# Patient Record
Sex: Female | Born: 1935 | Race: Black or African American | Hispanic: No | Marital: Single | Smoking: Never smoker
Health system: Southern US, Community
[De-identification: ages and names within clinical notes are randomized; demographics above are authoritative.]

## PROBLEM LIST (undated history)

## (undated) DIAGNOSIS — Z950 Presence of cardiac pacemaker: Secondary | ICD-10-CM

## (undated) DIAGNOSIS — G309 Alzheimer's disease, unspecified: Secondary | ICD-10-CM

## (undated) DIAGNOSIS — F0280 Dementia in other diseases classified elsewhere without behavioral disturbance: Secondary | ICD-10-CM

## (undated) DIAGNOSIS — I10 Essential (primary) hypertension: Secondary | ICD-10-CM

## (undated) DIAGNOSIS — R569 Unspecified convulsions: Secondary | ICD-10-CM

## (undated) DIAGNOSIS — E7800 Pure hypercholesterolemia, unspecified: Secondary | ICD-10-CM

## (undated) DIAGNOSIS — M109 Gout, unspecified: Secondary | ICD-10-CM

## (undated) DIAGNOSIS — E119 Type 2 diabetes mellitus without complications: Secondary | ICD-10-CM

## (undated) DIAGNOSIS — I639 Cerebral infarction, unspecified: Secondary | ICD-10-CM

## (undated) DIAGNOSIS — M1990 Unspecified osteoarthritis, unspecified site: Secondary | ICD-10-CM

## (undated) HISTORY — PX: INSERT / REPLACE / REMOVE PACEMAKER: SUR710

## (undated) HISTORY — PX: CATARACT EXTRACTION, BILATERAL: SHX1313

---

## 2016-01-25 ENCOUNTER — Encounter (HOSPITAL_COMMUNITY): Payer: Self-pay | Admitting: Emergency Medicine

## 2016-01-25 ENCOUNTER — Inpatient Hospital Stay (HOSPITAL_COMMUNITY)
Admission: EM | Admit: 2016-01-25 | Discharge: 2016-01-28 | DRG: 689 | Disposition: A | Discharge status: Home Health | Payer: Medicare Other | Attending: Family Medicine | Admitting: Family Medicine

## 2016-01-25 ENCOUNTER — Emergency Department (HOSPITAL_COMMUNITY): Payer: Medicare Other

## 2016-01-25 DIAGNOSIS — I5022 Chronic systolic (congestive) heart failure: Secondary | ICD-10-CM | POA: Diagnosis present

## 2016-01-25 DIAGNOSIS — Z87891 Personal history of nicotine dependence: Secondary | ICD-10-CM

## 2016-01-25 DIAGNOSIS — E7800 Pure hypercholesterolemia, unspecified: Secondary | ICD-10-CM | POA: Diagnosis present

## 2016-01-25 DIAGNOSIS — N179 Acute kidney failure, unspecified: Secondary | ICD-10-CM | POA: Diagnosis present

## 2016-01-25 DIAGNOSIS — F0280 Dementia in other diseases classified elsewhere without behavioral disturbance: Secondary | ICD-10-CM | POA: Diagnosis present

## 2016-01-25 DIAGNOSIS — I110 Hypertensive heart disease with heart failure: Secondary | ICD-10-CM | POA: Diagnosis present

## 2016-01-25 DIAGNOSIS — N390 Urinary tract infection, site not specified: Principal | ICD-10-CM | POA: Diagnosis present

## 2016-01-25 DIAGNOSIS — G309 Alzheimer's disease, unspecified: Secondary | ICD-10-CM | POA: Diagnosis present

## 2016-01-25 DIAGNOSIS — R269 Unspecified abnormalities of gait and mobility: Secondary | ICD-10-CM

## 2016-01-25 DIAGNOSIS — Z79899 Other long term (current) drug therapy: Secondary | ICD-10-CM

## 2016-01-25 DIAGNOSIS — Z794 Long term (current) use of insulin: Secondary | ICD-10-CM

## 2016-01-25 DIAGNOSIS — Z950 Presence of cardiac pacemaker: Secondary | ICD-10-CM | POA: Diagnosis present

## 2016-01-25 DIAGNOSIS — Z95810 Presence of automatic (implantable) cardiac defibrillator: Secondary | ICD-10-CM

## 2016-01-25 DIAGNOSIS — I10 Essential (primary) hypertension: Secondary | ICD-10-CM | POA: Diagnosis not present

## 2016-01-25 DIAGNOSIS — I639 Cerebral infarction, unspecified: Secondary | ICD-10-CM | POA: Diagnosis present

## 2016-01-25 DIAGNOSIS — R55 Syncope and collapse: Secondary | ICD-10-CM | POA: Diagnosis not present

## 2016-01-25 DIAGNOSIS — I4891 Unspecified atrial fibrillation: Secondary | ICD-10-CM | POA: Diagnosis present

## 2016-01-25 DIAGNOSIS — I69954 Hemiplegia and hemiparesis following unspecified cerebrovascular disease affecting left non-dominant side: Secondary | ICD-10-CM

## 2016-01-25 DIAGNOSIS — R29810 Facial weakness: Secondary | ICD-10-CM | POA: Diagnosis present

## 2016-01-25 DIAGNOSIS — E119 Type 2 diabetes mellitus without complications: Secondary | ICD-10-CM | POA: Diagnosis not present

## 2016-01-25 DIAGNOSIS — Z9181 History of falling: Secondary | ICD-10-CM

## 2016-01-25 DIAGNOSIS — R569 Unspecified convulsions: Secondary | ICD-10-CM

## 2016-01-25 DIAGNOSIS — W19XXXA Unspecified fall, initial encounter: Secondary | ICD-10-CM | POA: Diagnosis present

## 2016-01-25 DIAGNOSIS — G9340 Encephalopathy, unspecified: Secondary | ICD-10-CM | POA: Diagnosis present

## 2016-01-25 HISTORY — DX: Unspecified convulsions: R56.9

## 2016-01-25 HISTORY — DX: Cerebral infarction, unspecified: I63.9

## 2016-01-25 HISTORY — DX: Gout, unspecified: M10.9

## 2016-01-25 HISTORY — DX: Dementia in other diseases classified elsewhere, unspecified severity, without behavioral disturbance, psychotic disturbance, mood disturbance, and anxiety: F02.80

## 2016-01-25 HISTORY — DX: Presence of cardiac pacemaker: Z95.0

## 2016-01-25 HISTORY — DX: Essential (primary) hypertension: I10

## 2016-01-25 HISTORY — DX: Alzheimer's disease, unspecified: G30.9

## 2016-01-25 HISTORY — DX: Type 2 diabetes mellitus without complications: E11.9

## 2016-01-25 HISTORY — DX: Unspecified osteoarthritis, unspecified site: M19.90

## 2016-01-25 HISTORY — DX: Pure hypercholesterolemia, unspecified: E78.00

## 2016-01-25 LAB — COMPREHENSIVE METABOLIC PANEL
ALT: 13 U/L — ABNORMAL LOW (ref 14–54)
ANION GAP: 7 (ref 5–15)
AST: 19 U/L (ref 15–41)
Albumin: 2.8 g/dL — ABNORMAL LOW (ref 3.5–5.0)
Alkaline Phosphatase: 112 U/L (ref 38–126)
BUN: 21 mg/dL — ABNORMAL HIGH (ref 6–20)
CHLORIDE: 112 mmol/L — AB (ref 101–111)
CO2: 21 mmol/L — ABNORMAL LOW (ref 22–32)
Calcium: 9.3 mg/dL (ref 8.9–10.3)
Creatinine, Ser: 2.02 mg/dL — ABNORMAL HIGH (ref 0.44–1.00)
GFR, EST AFRICAN AMERICAN: 25 mL/min — AB (ref >60)
GFR, EST NON AFRICAN AMERICAN: 22 mL/min — AB (ref >60)
Glucose, Bld: 119 mg/dL — ABNORMAL HIGH (ref 65–99)
POTASSIUM: 3.9 mmol/L (ref 3.5–5.1)
Sodium: 140 mmol/L (ref 135–145)
Total Bilirubin: 1.1 mg/dL (ref 0.3–1.2)
Total Protein: 6.8 g/dL (ref 6.5–8.1)

## 2016-01-25 LAB — CBG MONITORING, ED: Glucose-Capillary: 111 mg/dL — ABNORMAL HIGH (ref 65–99)

## 2016-01-25 LAB — CBC
HEMATOCRIT: 44 % (ref 36.0–46.0)
Hemoglobin: 15 g/dL (ref 12.0–15.0)
MCH: 29.8 pg (ref 26.0–34.0)
MCHC: 34.1 g/dL (ref 30.0–36.0)
MCV: 87.3 fL (ref 78.0–100.0)
Platelets: 137 10*3/uL — ABNORMAL LOW (ref 150–400)
RBC: 5.04 MIL/uL (ref 3.87–5.11)
RDW: 14.1 % (ref 11.5–15.5)
WBC: 9.5 10*3/uL (ref 4.0–10.5)

## 2016-01-25 LAB — PROTIME-INR
INR: 1.02
PROTHROMBIN TIME: 13.4 s (ref 11.4–15.2)

## 2016-01-25 LAB — I-STAT TROPONIN, ED: TROPONIN I, POC: 0.03 ng/mL (ref 0.00–0.08)

## 2016-01-25 LAB — RAPID URINE DRUG SCREEN, HOSP PERFORMED
Amphetamines: NOT DETECTED
BARBITURATES: NOT DETECTED
BENZODIAZEPINES: NOT DETECTED
COCAINE: NOT DETECTED
Opiates: NOT DETECTED
TETRAHYDROCANNABINOL: NOT DETECTED

## 2016-01-25 LAB — I-STAT CG4 LACTIC ACID, ED: LACTIC ACID, VENOUS: 1.71 mmol/L (ref 0.5–1.9)

## 2016-01-25 LAB — APTT: aPTT: 28 seconds (ref 24–36)

## 2016-01-25 MED ORDER — SODIUM CHLORIDE 0.9 % IV SOLN
1000.0000 mg | Freq: Once | INTRAVENOUS | Status: AC
  Administered 2016-01-25: 1000 mg via INTRAVENOUS
  Filled 2016-01-25: qty 10

## 2016-01-25 NOTE — ED Triage Notes (Signed)
Pt unable to give history, pt does have a bag of medication. Keppra filled 11/12/15, prescribed BID, bottle is full at this time.  Pt is alert, intermittently yelling out, pt does respond verbally when spoken tol.  Will wait for family, [place] on bottle is [place], [place]

## 2016-01-25 NOTE — ED Provider Notes (Signed)
[facility]-EMERGENCY DEPT Provider Note   CSN: [id number] Arrival date & time: 01/25/16  2055     History   Chief Complaint Chief Complaint  Patient presents with  . Loss of Consciousness    HPI [place] Gaines is a 82 y.o. female.  The history is provided by the EMS personnel. The history is limited by the condition of the patient (nonverbal, intermittently cooperative with commands).  Altered Mental Status   This is a new problem. The current episode started less than 1 hour ago. The problem has been gradually improving. Associated symptoms include somnolence and unresponsiveness. Risk factors include the patient not taking medications correctly (noted to have full Keppra bottle, filled 11/2015). Her past medical history is significant for seizures.    Past Medical History:  Diagnosis Date  . Alzheimer disease    per family  . Diabetes mellitus without complication (HCC)   . Hypercholesteremia   . Hypertension   . Pacemaker   . Seizures (HCC)   . Stroke [facility](HCC)     Patient Active Problem List   Diagnosis Date Noted  . Syncope 01/25/2016    Past Surgical History:  Procedure Laterality Date  . PERMANENT PACEMAKER INSERTION      OB History    No data available       Home Medications    Prior to Admission medications   Medication Sig Start Date End Date Taking? Authorizing Provider  calcitRIOL (ROCALTROL) 0.25 MCG capsule Take 0.25 mcg by mouth 3 (three) times a week. AS DIRECTED   Yes Historical Provider, MD  Insulin Glargine (LANTUS SOLOSTAR) 100 UNIT/ML Solostar Pen Inject 10 Units into the skin at bedtime.   Yes Historical Provider, MD  levETIRAcetam (KEPPRA) 1000 MG tablet Take 1,000 mg by mouth 2 (two) times daily.   Yes Historical Provider, MD  metoprolol succinate (TOPROL-XL) 50 MG 24 hr tablet Take 50 mg by mouth daily. Take with or immediately following a meal.   Yes Historical Provider, MD  rosuvastatin (CRESTOR) 10 MG tablet Take 10 mg by mouth daily.    Yes Historical Provider, MD  spironolactone (ALDACTONE) 25 MG tablet Take 25 mg by mouth every morning.   Yes Historical Provider, MD    Family History No family history on file.  Social History Social History  Substance Use Topics  . Smoking status: Former Games developermoker  . Smokeless tobacco: Never Used  . Alcohol use No     Allergies   Review of patient's allergies indicates no known allergies.   Review of Systems Review of Systems  Unable to perform ROS: Patient nonverbal     Physical Exam Updated Vital Signs BP 124/64   Pulse (!) 59   Temp 98.4 F (36.9 C) (Oral)   Resp 17   SpO2 100%   Physical Exam  Constitutional: She appears well-developed and well-nourished. No distress.  Intermittently cooperative with commands, unclear if understanding tasks being asked or questions. Hollers out "hey!" loudly to everything said/asked  HENT:  Head: Normocephalic and atraumatic.  Eyes: Conjunctivae are normal. Pupils are equal, round, and reactive to light. No scleral icterus.  Not cooperative for EOM's  Neck: Normal range of motion. Neck supple.  Cardiovascular: Normal rate and intact distal pulses.   Murmur heard. Irregular rhythm  Pulmonary/Chest: Effort normal and breath sounds normal. No respiratory distress.  Abdominal: Soft. She exhibits no distension. There is no tenderness.  Musculoskeletal: She exhibits no edema, tenderness or deformity.  Neurological: She is alert.  Awake, alert moves  b/l Ue's and RLE spontaneously. Appears to have left lower facial droop. Cooperates for grip, seems slightly weaker on left compared to right  Skin: Skin is warm and dry. Capillary refill takes less than 2 seconds. No rash noted. She is not diaphoretic.  Nursing note and vitals reviewed.    ED Treatments / Results  Labs (all labs ordered are listed, but only abnormal results are displayed) Labs Reviewed  CBC - Abnormal; Notable for the following:       Result Value   Platelets  137 (*)    All other components within normal limits  COMPREHENSIVE METABOLIC PANEL - Abnormal; Notable for the following:    Chloride 112 (*)    CO2 21 (*)    Glucose, Bld 119 (*)    BUN 21 (*)    Creatinine, Ser 2.02 (*)    Albumin 2.8 (*)    ALT 13 (*)    GFR calc non Af Amer 22 (*)    GFR calc Af Amer 25 (*)    All other components within normal limits  CBG MONITORING, ED - Abnormal; Notable for the following:    Glucose-Capillary 111 (*)    All other components within normal limits  RAPID URINE DRUG SCREEN, HOSP PERFORMED  PROTIME-INR  APTT  URINALYSIS, ROUTINE W REFLEX MICROSCOPIC (NOT AT [facility])  I-STAT CG4 LACTIC ACID, ED  I-STAT TROPOININ, ED    EKG  EKG Interpretation  Date/Time:  Wednesday January 25 2016 21:05:53 EDT Ventricular Rate:  66 PR Interval:    QRS Duration: 116 QT Interval:  450 QTC Calculation: 486 R Axis:   -72 Text Interpretation:  Atrial fibrillation Left anterior fascicular block Abnormal T, consider ischemia, lateral leads No previous ECGs available Confirmed by Manus Gunning  MD, STEPHEN [phone]) on 01/25/2016 9:21:41 PM       Radiology Ct Head Wo Contrast  Result Date: 01/25/2016 CLINICAL DATA:  Found unresponsive. Speech disturbance. History of seizures. Left facial droop and left-sided weakness. EXAM: CT HEAD WITHOUT CONTRAST TECHNIQUE: Contiguous axial images were obtained from the base of the skull through the vertex without intravenous contrast. COMPARISON:  None. FINDINGS: Brain: No brainstem lesion is seen. There is cerebellar atrophy. There is an old infarction in the right middle cerebral artery territory affecting the insula, lateral basal ganglia and frontoparietal cortical and subcortical brain, with progression to atrophy and encephalomalacia. There are chronic small-vessel white matter ischemic changes in both hemispheres. No sign of acute infarction, mass lesion, hemorrhage, hydrocephalus or extra-axial collection. Vascular: There is  atherosclerotic calcification of the major vessels at the base of the brain. Skull: No fracture. Sinuses/Orbits: Sinuses are clear except for small amount of fluid in the left maxillary sinus. Other: None significant IMPRESSION: No acute finding by CT. Old infarction in the right MCA territory. Chronic small-vessel ischemic changes. Generalized brain atrophy. Electronically Signed   By: Paulina Fusi M.D.   On: 01/25/2016 23:03   Dg Chest Portable 1 View  Result Date: 01/25/2016 CLINICAL DATA:  Loss of consciousness today.  History of seizures. EXAM: PORTABLE CHEST 1 VIEW COMPARISON:  None. FINDINGS: There is left ventricular prominence. Pacemaker AICD is in place. There is aortic atherosclerosis. The pulmonary vascularity is normal. The lungs are clear. No effusions. No acute bone finding. IMPRESSION: No active disease. Left ventricular prominence. AICD. Aortic atherosclerosis. Electronically Signed   By: Paulina Fusi M.D.   On: 01/25/2016 23:00    Procedures Procedures (including critical care time)  Medications Ordered in ED  Medications  levETIRAcetam (KEPPRA) 1,000 mg in sodium chloride 0.9 % 100 mL IVPB (1,000 mg Intravenous New Bag/Given 01/25/16 2333)     Initial Impression / Assessment and Plan / ED Course  I have reviewed the triage vital signs and the nursing notes.  Pertinent labs & imaging results that were available during my care of the patient were reviewed by me and considered in my medical decision making (see chart for details).  Clinical Course   [place] Gaines is a 82 y.o. female with h/o seizure disorder who presents to ED via EMS from family member's home where she now resides for unresponsiveness x 15 minutes prior to EMS arrival. On EMS arrival to pick up pt, CBG > 100, VSS, confused and hollering "hey!" out loud to all verbal communication, which family told EMS is normal for her. H&P, as well as PMSHx limited 2/2 no medical records here or in care everywhere. By  review of medications, other medical problems include apparent IDDM (on lantus), HTN, HLD, seizures. Of note, keppra bottle filled in 11/2015 appears full, at first under impression that pt is not taking, so loaded with keppra in ED for possible seizure. However, after keppra load, family arrived, states pt has been taking keppra BID with old Rx from bottle that was not sent with EMS.  Per family, pt had a similar episode Saturday (4 days ago) where she was sitting in her wheelchair with baseline interactions looking and interactive with them, then suddenly slumped over unresponsive x 20 minutes. When paramedics arrived, her BG at that time was 69, she improved and glucose improved so not transported at that time.   Concern for two episodes of prolonged syncope in last 4 days. Will benefit from telemetry. No obvious infection on CXR. UDS negative. Cr elevated to 2, unknown baseline, pt family does not know if pt has kidney problems. CT head with old right MCA CVA (c/w pt h/o chronic left sided weakness). Not clinically appearing dehydrated.  Pt condition, course, and admission were discussed with attending physician Dr. Glynn Octave.  Final Clinical Impressions(s) / ED Diagnoses   Final diagnoses:  Syncope and collapse    New Prescriptions New Prescriptions   No medications on file     Horald Pollen, MD 01/25/16 2358    Glynn Octave, MD 01/26/16 [phone]

## 2016-01-25 NOTE — H&P (Signed)
History and Physical    [place] ZOX:[id number] DOB: 07-09-1930 DOA: 01/25/2016  Referring MD/NP/PA:   PCP: No primary care provider on file.   Patient coming from:  The patient is coming from home.  At baseline, pt is partially dependent for her ADL.   Chief Complaint: syncope, AMS and fall  HPI: [place] Gaines is a 82 y.o. female with medical history significant of Alzheimer's disease, hypertension, hyperlipidemia, diabetes mellitus, stroke with left facial droop and mild left-sided weakness at the baseline, seizure on Keppra, s/p of AICD placement, who presents with altered mental status, syncope and fall.  Pt has recently moved from [place]. Per patient's daughter-in-law, patient had syncope episode at about 6 PM. Patient was unresponsive for about 15 minutes at about 6:00 PM. No seizure activity was noted. Pt is moreconfused and yelling out. Per family, patient had a similar episode on Saturday. Recently patient has multiple falls. Per her daughter-in-law, patient has been compliant to her seizure medications Keppra. Per family, patient does not seem to have chest pain, shortness of breath, coughing, nausea, vomiting, diarrhea or abdominal pain. Not sure if she has symptoms of UTI. Patient moves all extremities. Patient does not seem to have vision or hearing loss. Pacemaker interrogation was done in ED, which did not have events.  ED Course: pt was found to have WBC 9.5, negative UDS, lactate 1.71, INR 1.02, PTT 28, creatinine 2.02, bradycardia, temperature normal. Negative chest x-ray. CT head is negative for acute intracranial abnormalities. Pt is paced on telemetry bed for observation.  Review of Systems: Could not be reviewed due to altered mental status and Alzheimer.  Allergy: No Known Allergies  Past Medical History:  Diagnosis Date  . Alzheimer disease    per family  . Diabetes mellitus without complication (HCC)   . Hypercholesteremia   . Hypertension   .  Pacemaker   . Seizures (HCC)   . Stroke [facility])     Past Surgical History:  Procedure Laterality Date  . PERMANENT PACEMAKER INSERTION      Social History:  reports that she has quit smoking. She has never used smokeless tobacco. She reports that she does not drink alcohol or use drugs.  Family History: Could not be reviewed due to altered mental status and dementia.  Prior to Admission medications   Medication Sig Start Date End Date Taking? Authorizing Provider  calcitRIOL (ROCALTROL) 0.25 MCG capsule Take 0.25 mcg by mouth 3 (three) times a week. AS DIRECTED   Yes Historical Provider, MD  Insulin Glargine (LANTUS SOLOSTAR) 100 UNIT/ML Solostar Pen Inject 10 Units into the skin at bedtime.   Yes Historical Provider, MD  levETIRAcetam (KEPPRA) 1000 MG tablet Take 1,000 mg by mouth 2 (two) times daily.   Yes Historical Provider, MD  metoprolol succinate (TOPROL-XL) 50 MG 24 hr tablet Take 50 mg by mouth daily. Take with or immediately following a meal.   Yes Historical Provider, MD  rosuvastatin (CRESTOR) 10 MG tablet Take 10 mg by mouth daily.   Yes Historical Provider, MD  spironolactone (ALDACTONE) 25 MG tablet Take 25 mg by mouth every morning.   Yes Historical Provider, MD    Physical Exam: Vitals:   01/26/16 0001 01/26/16 0100 01/26/16 0213 01/26/16 0601  BP: 140/76 129/67 (!) 160/74 (!) 144/70  Pulse: (!) 59 73 (!) 59 60  Resp: 12 22 18 18   Temp:   97.5 F (36.4 C) 98.2 F (36.8 C)  TempSrc:   Oral Oral  SpO2: 100% 100%  92% 100%  Weight:   65.5 kg (144 lb 4.8 oz)   Height:   5\' 5"  (1.651 m)    General: Not in acute distress HEENT:       Eyes: PERRL, EOMI, no scleral icterus.       ENT: No discharge from the ears and nose, no pharynx injection, no tonsillar enlargement.        Neck: No JVD, no bruit, no mass felt. Heme: No neck lymph node enlargement. Cardiac: S1/S2, RRR, No murmurs, No gallops or rubs. Respiratory:  No rales, wheezing, rhonchi or rubs. GI: Soft,  nondistended, nontender, no rebound pain, no organomegaly, BS present. GU: No hematuria Ext: No pitting leg edema bilaterally. 2+DP/PT pulse bilaterally. Musculoskeletal: No joint deformities, No joint redness or warmth, no limitation of ROM in spin. Skin: No rashes.  Neuro: Alert, not oriented X3, cranial nerves II-XII except for mild left facial droop.  Muscle strength 4/5 left arm and leg, 5/5 in right extremities. Knee reflex 1+ bilaterally. Negative Babinski's sign. Psych: Could not be reviewed due to altered mental status and dementia.   Labs on Admission: I have personally reviewed following labs and imaging studies  CBC:  Recent Labs Lab 01/25/16 2124  WBC 9.5  HGB 15.0  HCT 44.0  MCV 87.3  PLT 137*   Basic Metabolic Panel:  Recent Labs Lab 01/25/16 2153  NA 140  K 3.9  CL 112*  CO2 21*  GLUCOSE 119*  BUN 21*  CREATININE 2.02*  CALCIUM 9.3   GFR: Estimated Creatinine Clearance: 19 mL/min (by C-G formula based on SCr of 2.02 mg/dL (H)). Liver Function Tests:  Recent Labs Lab 01/25/16 2153  AST 19  ALT 13*  ALKPHOS 112  BILITOT 1.1  PROT 6.8  ALBUMIN 2.8*   No results for input(s): LIPASE, AMYLASE in the last 168 hours. No results for input(s): AMMONIA in the last 168 hours. Coagulation Profile:  Recent Labs Lab 01/25/16 2153  INR 1.02   Cardiac Enzymes: No results for input(s): CKTOTAL, CKMB, CKMBINDEX, TROPONINI in the last 168 hours. BNP (last 3 results) No results for input(s): PROBNP in the last 8760 hours. HbA1C: No results for input(s): HGBA1C in the last 72 hours. CBG:  Recent Labs Lab 01/25/16 2345 01/26/16 0218  GLUCAP 111* 122*   Lipid Profile: No results for input(s): CHOL, HDL, LDLCALC, TRIG, CHOLHDL, LDLDIRECT in the last 72 hours. Thyroid Function Tests: No results for input(s): TSH, T4TOTAL, FREET4, T3FREE, THYROIDAB in the last 72 hours. Anemia Panel: No results for input(s): VITAMINB12, FOLATE, FERRITIN, TIBC, IRON,  RETICCTPCT in the last 72 hours. Urine analysis:    Component Value Date/Time   COLORURINE YELLOW 01/25/2016 2124   APPEARANCEUR CLOUDY (A) 01/25/2016 2124   LABSPEC 1.019 01/25/2016 2124   PHURINE 5.5 01/25/2016 2124   GLUCOSEU NEGATIVE 01/25/2016 2124   HGBUR NEGATIVE 01/25/2016 2124   BILIRUBINUR SMALL (A) 01/25/2016 2124   KETONESUR NEGATIVE 01/25/2016 2124   PROTEINUR 30 (A) 01/25/2016 2124   NITRITE NEGATIVE 01/25/2016 2124   LEUKOCYTESUR MODERATE (A) 01/25/2016 2124   Sepsis Labs: @LABRCNTIP (procalcitonin:4,lacticidven:4) )No results found for this or any previous visit (from the past 240 hour(s)).   Radiological Exams on Admission: Ct Head Wo Contrast  Result Date: 01/25/2016 CLINICAL DATA:  Found unresponsive. Speech disturbance. History of seizures. Left facial droop and left-sided weakness. EXAM: CT HEAD WITHOUT CONTRAST TECHNIQUE: Contiguous axial images were obtained from the base of the skull through the vertex without intravenous contrast. COMPARISON:  None. FINDINGS: Brain: No brainstem lesion is seen. There is cerebellar atrophy. There is an old infarction in the right middle cerebral artery territory affecting the insula, lateral basal ganglia and frontoparietal cortical and subcortical brain, with progression to atrophy and encephalomalacia. There are chronic small-vessel white matter ischemic changes in both hemispheres. No sign of acute infarction, mass lesion, hemorrhage, hydrocephalus or extra-axial collection. Vascular: There is atherosclerotic calcification of the major vessels at the base of the brain. Skull: No fracture. Sinuses/Orbits: Sinuses are clear except for small amount of fluid in the left maxillary sinus. Other: None significant IMPRESSION: No acute finding by CT. Old infarction in the right MCA territory. Chronic small-vessel ischemic changes. Generalized brain atrophy. Electronically Signed   By: Paulina FusiMark  Shogry M.D.   On: 01/25/2016 23:03   Dg Chest  Portable 1 View  Result Date: 01/25/2016 CLINICAL DATA:  Loss of consciousness today.  History of seizures. EXAM: PORTABLE CHEST 1 VIEW COMPARISON:  None. FINDINGS: There is left ventricular prominence. Pacemaker AICD is in place. There is aortic atherosclerosis. The pulmonary vascularity is normal. The lungs are clear. No effusions. No acute bone finding. IMPRESSION: No active disease. Left ventricular prominence. AICD. Aortic atherosclerosis. Electronically Signed   By: Paulina FusiMark  Shogry M.D.   On: 01/25/2016 23:00     EKG: Independently reviewed. Atrial fibrillation, QTC 486, PVC, LAD, T-wave inversion in lead V 4-V6 and II. No old EKG to compare with.   Assessment/Plan Principal Problem:   Syncope Active Problems:   Stroke (HCC)   Seizures (HCC)   Pacemaker   Hypertension   Hypercholesteremia   Diabetes mellitus without complication (HCC)   Alzheimer disease   Fall   Atrial fibrillation (HCC)   UTI (urinary tract infection)   Syncope: Etiology is not clear. No seizure activity noted per family. Patient moves all extremities. She has left facial droop and mild left-sided weakness from previous stroke, which seems to be not changed. CT head is negative for acute intracranial abnormalities. Pacemaker interrogation was done in ED, which did not have events.  - place tele bed for obs - Orthostatic vital signs  - Trending troponins x3  - Carotid doppler - 2d echo - Neuro checks  - check A1c, FLP - start ASA - PT/OT eval and treat - SLP - Request medical record  UTI: Urinalysis is positive with moderate amount of leukocytes, indicating UTI. Patient is not septic. Lactate is normal. Hemodynamically stable - Ceftriaxone by IV - Follow up results of urine and blood cx and amend antibiotic regimen if needed per sensitivity results  Acute encephalopathy: Likely due to combination of UTI and syncope. -Treat UTI as above -Recommend neuro check  Hx of stroke: -start ASA per  rectum -continue crestor  Seizures (HCC): -switch oral keppra to IV -check keppra level -seizure precaution  HTN: -continue metoprolol -Hold spironolactone due to AKI -IV hydralazine when necessary  HLD: Last LDL was not on  record -Continue home medications: crestor -Check FLP  DM-II: Last A1c not on record. Patient is taking lantus at home -will decrease Lantus dose from 10-->7 units daily  -SSI -Check A1c  Atrial fibrillation [facility](HCC): EKG showed atrial fibrillation, not sure if patient has history of atrial fibrillation. CHA2DS2-VASc Score is 7, needs oral anticoagulation, but [facility] due to high risk of fall. -started ASA -check TSH -f/u trop and 2d echo.  DVT ppx: SQ Lovenox Code Status: Full code Family Communication:  Yes, patient's  daugher-in  law  at bed side Disposition Plan:  Anticipate discharge back to previous home environment Consults called: none Admission status: Obs / tele  Date of Service 01/26/2016    Lorretta Harp [facility] Pager [phone]  If 7PM-7AM, please contact night-coverage [email or web address] Password [facility] 01/26/2016, 6:47 AM

## 2016-01-25 NOTE — ED Triage Notes (Signed)
Pt transported from home by EMS for c/o AMS. Per EMS family [facility] Hx of seizures.  Pt intermittently yelling out on arrival. L sided facial droop and L sided weakness at baseline from prior CVA per EMS. unsuccessfull IV x 2 by EMS.

## 2016-01-25 NOTE — ED Notes (Signed)
Pt to radiology via stretcher.  

## 2016-01-25 NOTE — ED Notes (Signed)
Family now at bedside

## 2016-01-26 ENCOUNTER — Encounter (HOSPITAL_COMMUNITY): Payer: Self-pay | Admitting: Internal Medicine

## 2016-01-26 DIAGNOSIS — F0280 Dementia in other diseases classified elsewhere without behavioral disturbance: Secondary | ICD-10-CM

## 2016-01-26 DIAGNOSIS — N179 Acute kidney failure, unspecified: Secondary | ICD-10-CM | POA: Diagnosis present

## 2016-01-26 DIAGNOSIS — I5022 Chronic systolic (congestive) heart failure: Secondary | ICD-10-CM | POA: Diagnosis present

## 2016-01-26 DIAGNOSIS — I10 Essential (primary) hypertension: Secondary | ICD-10-CM | POA: Diagnosis present

## 2016-01-26 DIAGNOSIS — I4891 Unspecified atrial fibrillation: Secondary | ICD-10-CM | POA: Diagnosis present

## 2016-01-26 DIAGNOSIS — R569 Unspecified convulsions: Secondary | ICD-10-CM

## 2016-01-26 DIAGNOSIS — I69954 Hemiplegia and hemiparesis following unspecified cerebrovascular disease affecting left non-dominant side: Secondary | ICD-10-CM | POA: Diagnosis not present

## 2016-01-26 DIAGNOSIS — Z794 Long term (current) use of insulin: Secondary | ICD-10-CM | POA: Diagnosis not present

## 2016-01-26 DIAGNOSIS — N390 Urinary tract infection, site not specified: Secondary | ICD-10-CM | POA: Diagnosis present

## 2016-01-26 DIAGNOSIS — E7800 Pure hypercholesterolemia, unspecified: Secondary | ICD-10-CM | POA: Diagnosis present

## 2016-01-26 DIAGNOSIS — I639 Cerebral infarction, unspecified: Secondary | ICD-10-CM | POA: Diagnosis present

## 2016-01-26 DIAGNOSIS — Z87891 Personal history of nicotine dependence: Secondary | ICD-10-CM | POA: Diagnosis not present

## 2016-01-26 DIAGNOSIS — Z95810 Presence of automatic (implantable) cardiac defibrillator: Secondary | ICD-10-CM | POA: Diagnosis not present

## 2016-01-26 DIAGNOSIS — W19XXXA Unspecified fall, initial encounter: Secondary | ICD-10-CM | POA: Diagnosis present

## 2016-01-26 DIAGNOSIS — R55 Syncope and collapse: Secondary | ICD-10-CM | POA: Insufficient documentation

## 2016-01-26 DIAGNOSIS — G309 Alzheimer's disease, unspecified: Secondary | ICD-10-CM | POA: Diagnosis present

## 2016-01-26 DIAGNOSIS — Z79899 Other long term (current) drug therapy: Secondary | ICD-10-CM | POA: Diagnosis not present

## 2016-01-26 DIAGNOSIS — E119 Type 2 diabetes mellitus without complications: Secondary | ICD-10-CM | POA: Diagnosis present

## 2016-01-26 DIAGNOSIS — G9340 Encephalopathy, unspecified: Secondary | ICD-10-CM | POA: Diagnosis present

## 2016-01-26 DIAGNOSIS — I110 Hypertensive heart disease with heart failure: Secondary | ICD-10-CM | POA: Diagnosis present

## 2016-01-26 DIAGNOSIS — Z950 Presence of cardiac pacemaker: Secondary | ICD-10-CM | POA: Diagnosis present

## 2016-01-26 DIAGNOSIS — Z9181 History of falling: Secondary | ICD-10-CM | POA: Diagnosis not present

## 2016-01-26 DIAGNOSIS — R29810 Facial weakness: Secondary | ICD-10-CM | POA: Diagnosis present

## 2016-01-26 LAB — CREATININE, URINE, RANDOM: Creatinine, Urine: 182.23 mg/dL

## 2016-01-26 LAB — URINALYSIS, ROUTINE W REFLEX MICROSCOPIC
GLUCOSE, UA: NEGATIVE mg/dL
HGB URINE DIPSTICK: NEGATIVE
Ketones, ur: NEGATIVE mg/dL
Nitrite: NEGATIVE
PH: 5.5 (ref 5.0–8.0)
PROTEIN: 30 mg/dL — AB
SPECIFIC GRAVITY, URINE: 1.019 (ref 1.005–1.030)

## 2016-01-26 LAB — CBC
HEMATOCRIT: 42.9 % (ref 36.0–46.0)
HEMOGLOBIN: 14.6 g/dL (ref 12.0–15.0)
MCH: 29.7 pg (ref 26.0–34.0)
MCHC: 34 g/dL (ref 30.0–36.0)
MCV: 87.4 fL (ref 78.0–100.0)
Platelets: 131 10*3/uL — ABNORMAL LOW (ref 150–400)
RBC: 4.91 MIL/uL (ref 3.87–5.11)
RDW: 14.2 % (ref 11.5–15.5)
WBC: 10.4 10*3/uL (ref 4.0–10.5)

## 2016-01-26 LAB — BASIC METABOLIC PANEL
Anion gap: 8 (ref 5–15)
BUN: 23 mg/dL — AB (ref 6–20)
CHLORIDE: 114 mmol/L — AB (ref 101–111)
CO2: 18 mmol/L — ABNORMAL LOW (ref 22–32)
Calcium: 9.1 mg/dL (ref 8.9–10.3)
Creatinine, Ser: 1.83 mg/dL — ABNORMAL HIGH (ref 0.44–1.00)
GFR calc non Af Amer: 24 mL/min — ABNORMAL LOW (ref >60)
GFR, EST AFRICAN AMERICAN: 28 mL/min — AB (ref >60)
Glucose, Bld: 98 mg/dL (ref 65–99)
POTASSIUM: 4 mmol/L (ref 3.5–5.1)
SODIUM: 140 mmol/L (ref 135–145)

## 2016-01-26 LAB — GLUCOSE, CAPILLARY
GLUCOSE-CAPILLARY: 122 mg/dL — AB (ref 65–99)
GLUCOSE-CAPILLARY: 126 mg/dL — AB (ref 65–99)
GLUCOSE-CAPILLARY: 161 mg/dL — AB (ref 65–99)
GLUCOSE-CAPILLARY: 85 mg/dL (ref 65–99)
GLUCOSE-CAPILLARY: 122 mg/dL — AB (ref 65–99)

## 2016-01-26 LAB — TSH: TSH: 2.273 u[IU]/mL (ref 0.350–4.500)

## 2016-01-26 LAB — TROPONIN I
TROPONIN I: 0.03 ng/mL — AB (ref <0.03)
TROPONIN I: 0.04 ng/mL — AB (ref <0.03)
TROPONIN I: 0.03 ng/mL — AB (ref <0.03)
Troponin I: 0.03 ng/mL (ref <0.03)

## 2016-01-26 LAB — URINE MICROSCOPIC-ADD ON

## 2016-01-26 LAB — LIPID PANEL
CHOLESTEROL: 136 mg/dL (ref 0–200)
HDL: 32 mg/dL — AB (ref >40)
LDL Cholesterol: 87 mg/dL (ref 0–99)
Total CHOL/HDL Ratio: 4.3 RATIO
Triglycerides: 83 mg/dL (ref <150)
VLDL: 17 mg/dL (ref 0–40)

## 2016-01-26 MED ORDER — SODIUM CHLORIDE 0.9 % IV SOLN: 1000.0000 mg | Freq: Two times a day (BID) | INTRAVENOUS | Status: DC

## 2016-01-26 MED ORDER — SODIUM CHLORIDE 0.9% FLUSH
3.0000 mL | Freq: Two times a day (BID) | INTRAVENOUS | Status: DC
  Administered 2016-01-26: 3 mL via INTRAVENOUS

## 2016-01-26 MED ORDER — HYDRALAZINE HCL 20 MG/ML IJ SOLN: 5.0000 mg | INTRAMUSCULAR | Status: DC | PRN

## 2016-01-26 MED ORDER — ONDANSETRON HCL 4 MG PO TABS: 4.0000 mg | ORAL_TABLET | Freq: Four times a day (QID) | ORAL | Status: DC | PRN

## 2016-01-26 MED ORDER — ENOXAPARIN SODIUM 40 MG/0.4ML ~~LOC~~ SOLN
40.0000 mg | SUBCUTANEOUS | Status: DC
  Administered 2016-01-26: 40 mg via SUBCUTANEOUS
  Filled 2016-01-26: qty 0.4

## 2016-01-26 MED ORDER — CHLORHEXIDINE GLUCONATE 0.12 % MT SOLN
15.0000 mL | Freq: Two times a day (BID) | OROMUCOSAL | Status: DC
  Administered 2016-01-26 – 2016-01-28 (×5): 15 mL via OROMUCOSAL
  Filled 2016-01-26 (×5): qty 15

## 2016-01-26 MED ORDER — ONDANSETRON HCL 4 MG/2ML IJ SOLN: 4.0000 mg | Freq: Four times a day (QID) | INTRAMUSCULAR | Status: DC | PRN

## 2016-01-26 MED ORDER — CALCITRIOL 0.25 MCG PO CAPS
0.2500 ug | ORAL_CAPSULE | ORAL | Status: DC
  Administered 2016-01-27: 0.25 ug via ORAL
  Filled 2016-01-26 (×2): qty 1

## 2016-01-26 MED ORDER — ACETAMINOPHEN 325 MG PO TABS: 650.0000 mg | ORAL_TABLET | Freq: Four times a day (QID) | ORAL | Status: DC | PRN

## 2016-01-26 MED ORDER — INSULIN GLARGINE 100 UNIT/ML ~~LOC~~ SOLN
7.0000 [IU] | Freq: Every day | SUBCUTANEOUS | Status: DC
  Administered 2016-01-26 – 2016-01-27 (×3): 7 [IU] via SUBCUTANEOUS
  Filled 2016-01-26 (×4): qty 0.07

## 2016-01-26 MED ORDER — ENOXAPARIN SODIUM 30 MG/0.3ML ~~LOC~~ SOLN
30.0000 mg | SUBCUTANEOUS | Status: DC
  Administered 2016-01-27 – 2016-01-28 (×2): 30 mg via SUBCUTANEOUS
  Filled 2016-01-26 (×2): qty 0.3

## 2016-01-26 MED ORDER — ASPIRIN 300 MG RE SUPP
150.0000 mg | Freq: Every day | RECTAL | Status: DC
  Administered 2016-01-26: 150 mg via RECTAL
  Filled 2016-01-26: qty 1

## 2016-01-26 MED ORDER — ORAL CARE MOUTH RINSE
15.0000 mL | Freq: Two times a day (BID) | OROMUCOSAL | Status: DC
  Administered 2016-01-26 – 2016-01-27 (×4): 15 mL via OROMUCOSAL

## 2016-01-26 MED ORDER — ASPIRIN 81 MG PO CHEW
81.0000 mg | CHEWABLE_TABLET | Freq: Every day | ORAL | Status: DC
  Administered 2016-01-27 – 2016-01-28 (×2): 81 mg via ORAL
  Filled 2016-01-26 (×2): qty 1

## 2016-01-26 MED ORDER — METOPROLOL SUCCINATE ER 25 MG PO TB24
25.0000 mg | ORAL_TABLET | Freq: Every day | ORAL | Status: DC
  Administered 2016-01-26 – 2016-01-28 (×3): 25 mg via ORAL
  Filled 2016-01-26 (×3): qty 1

## 2016-01-26 MED ORDER — ROSUVASTATIN CALCIUM 10 MG PO TABS
10.0000 mg | ORAL_TABLET | Freq: Every day | ORAL | Status: DC
  Administered 2016-01-26 – 2016-01-27 (×2): 10 mg via ORAL
  Filled 2016-01-26 (×2): qty 1

## 2016-01-26 MED ORDER — SODIUM CHLORIDE 0.9 % IV SOLN
INTRAVENOUS | Status: DC
  Administered 2016-01-26: 03:00:00 via INTRAVENOUS
  Administered 2016-01-26: 1000 mL via INTRAVENOUS

## 2016-01-26 MED ORDER — ACETAMINOPHEN 650 MG RE SUPP: 650.0000 mg | Freq: Four times a day (QID) | RECTAL | Status: DC | PRN

## 2016-01-26 MED ORDER — LEVETIRACETAM 500 MG PO TABS
1000.0000 mg | ORAL_TABLET | Freq: Two times a day (BID) | ORAL | Status: DC
  Administered 2016-01-26 – 2016-01-28 (×5): 1000 mg via ORAL
  Filled 2016-01-26 (×5): qty 2

## 2016-01-26 MED ORDER — ZOLPIDEM TARTRATE 5 MG PO TABS: 5.0000 mg | ORAL_TABLET | Freq: Every evening | ORAL | Status: DC | PRN

## 2016-01-26 MED ORDER — INSULIN ASPART 100 UNIT/ML ~~LOC~~ SOLN
0.0000 [IU] | Freq: Three times a day (TID) | SUBCUTANEOUS | Status: DC
  Administered 2016-01-26: 2 [IU] via SUBCUTANEOUS
  Administered 2016-01-26: 1 [IU] via SUBCUTANEOUS

## 2016-01-26 MED ORDER — DEXTROSE 5 % IV SOLN
1.0000 g | Freq: Every day | INTRAVENOUS | Status: DC
  Administered 2016-01-26 – 2016-01-27 (×2): 1 g via INTRAVENOUS
  Filled 2016-01-26 (×3): qty 10

## 2016-01-26 NOTE — ED Notes (Signed)
Pt has [employer] A[employer]  Model CD [id number] Serial [phone]#[id number]

## 2016-01-26 NOTE — ED Notes (Signed)
Report called to Pioneer[place], RN 19M

## 2016-01-26 NOTE — Progress Notes (Signed)
Patient in OBS 12 hours, no family at bedside, patient is not oriented and is calling out in room. Unable to review MOON letter at this time. Will follow up with family by phone tomorrow if patient still disoriented and alone.

## 2016-01-26 NOTE — Evaluation (Signed)
Physical Therapy Evaluation Patient Details Name: Dawn Gaines MRN: [id number] DOB: 08/14/1930 Today's Date: 01/26/2016   History of Present Illness  Dawn Gaines is a 82 y.o. female with a history of Alzheimer's disease, hypertension, hyperlipidemia, diabetes mellitus, stroke with left facial droop and mild left-sided weakness at the baseline, seizure on Keppra, s/p of AICD placement, who presents with altered mental status, syncope and fall  Clinical Impression  Patient presents with decreased independence with mobility (though her prior level of function is not clear) due to deficits listed in PT problem list, and she will benefit from skilled PT in the acute setting to allow return home with family support. Will need capable 24 hour assist and wheelchair accessible home. Otherwise may need SNF level of care if pt eligible with admission status.     Follow Up Recommendations Home health PT;Supervision/Assistance - 24 hour    Equipment Recommendations  Wheelchair (measurements PT);Wheelchair cushion (measurements PT)    Recommendations for Other Services       Precautions / Restrictions Precautions Precautions: Fall      Mobility  Bed Mobility Overal bed mobility: Needs Assistance Bed Mobility: Supine to Sit     Supine to sit: Mod assist     General bed mobility comments: assist to lift trunk upright  Transfers Overall transfer level: Needs assistance   Transfers: Sit to/from Stand;Squat Pivot Transfers Sit to Stand: Mod assist;Max assist   Squat pivot transfers: Max assist     General transfer comment: [facility] and lowering help, sat on edge of seat, multiple repositioning efforts to get centered on BSC; sit to stand several times with walker and mod/max A for lifting and lowering, pt pushing to sit back down allowing feet to slide out from under her needing assist to prevent falling; pivot to chair with armrest down and pivot pt sitting again  near edge of chair and needing assist to scoot back on seat and for positioning for safety  Ambulation/Gait                Stairs            Wheelchair Mobility    Modified Rankin (Stroke Patients Only)       Balance Overall balance assessment: Needs assistance   Sitting balance-Leahy Scale: Fair     Standing balance support: Bilateral upper extremity supported;During functional activity Standing balance-Leahy Scale: Poor Standing balance comment: mod support for standing balance briefly while NT assist for hygiene after toileting                             Pertinent Vitals/Pain Pain Assessment: Faces Faces Pain Scale: No hurt    Home Living Family/patient expects to be discharged to:: Private residence   Available Help at Discharge: Family             Additional Comments: noted from chart pt from home, but pt unable to give any history of her environment or her previous functional level    Prior Function Level of Independence: Needs assistance   Gait / Transfers Assistance Needed: chart notes recent falls  ADL's / Homemaking Assistance Needed: chart noted previously dependent for ADL        Hand Dominance        Extremity/Trunk Assessment   Upper Extremity Assessment: Difficult to assess due to impaired cognition           Lower Extremity Assessment: Difficult to  assess due to impaired cognition      Cervical / Trunk Assessment: Kyphotic  Communication   Communication: Expressive difficulties (mumbles, moans and calls out)  Cognition Arousal/Alertness: Awake/alert Behavior During Therapy: Anxious Overall Cognitive Status: No family/caregiver present to determine baseline cognitive functioning                      General Comments      Exercises     Assessment/Plan    PT Assessment Patient needs continued PT services  PT Problem List Decreased strength;Decreased balance;Decreased mobility;Decreased  safety awareness;Decreased knowledge of use of DME          PT Treatment Interventions DME instruction;Therapeutic activities;Gait training;Functional mobility training;Balance training;Therapeutic exercise;Patient/family education    PT Goals (Current goals can be found in the Care Plan section)  Acute Rehab PT Goals Patient Stated Goal: None stated PT Goal Formulation: Patient unable to participate in goal setting Time For Goal Achievement: 02/02/16 Potential to Achieve Goals: Fair    Frequency Min 3X/week   Barriers to discharge Other (comment) unknown previous functional status and amount of assist at home    Co-evaluation               End of Session Equipment Utilized During Treatment: Gait belt Activity Tolerance: Patient limited by fatigue Patient left: in chair;with call bell/phone within reach;with chair alarm set Nurse Communication: Mobility status    Functional Assessment Tool Used: Clinical Judgement Functional Limitation: Mobility: Walking and moving around Mobility: Walking and Moving Around Current Status [phone]): At least 40 percent but less than 60 percent impaired, limited or restricted Mobility: Walking and Moving Around Goal Status [phone]): At least 20 percent but less than 40 percent impaired, limited or restricted    Time: 1120-1152 PT Time Calculation (min) (ACUTE ONLY): 32 min   Charges:   PT Evaluation $PT Eval Moderate Complexity: 1 Procedure PT Treatments $Therapeutic Activity: 8-22 mins   PT G Codes:   PT G-Codes **NOT FOR INPATIENT CLASS** Functional Assessment Tool Used: Clinical Judgement Functional Limitation: Mobility: Walking and moving around Mobility: Walking and Moving Around Current Status (U9811): At least 40 percent but less than 60 percent impaired, limited or restricted Mobility: Walking and Moving Around Goal Status [phone]): At least 20 percent but less than 40 percent impaired, limited or restricted    Elray Mcgregor 01/26/2016, 4:50 PM  Sheran Lawless, PT [phone] 01/26/2016

## 2016-01-26 NOTE — Progress Notes (Signed)
PROGRESS NOTE    [place]  ZOX:[id number] DOB: [birth year] DOA: 01/25/2016 PCP: No primary care provider on file.   Brief Narrative: Dawn Gaines is a 82 y.o. female with a history of Alzheimer's disease, hypertension, hyperlipidemia, diabetes mellitus, stroke with left facial droop and mild left-sided weakness at the baseline, seizure on Keppra, s/p of AICD placement, who presents with altered mental status, syncope and fall.   Assessment & Plan:   Principal Problem:   Syncope Active Problems:   Stroke (HCC)   Seizures (HCC)   Pacemaker   Hypertension   Hypercholesteremia   Diabetes mellitus without complication (HCC)   Alzheimer disease   Fall   Atrial fibrillation (HCC)   UTI (urinary tract infection)   Syncope Unclear etiology. Troponin slightly elevated and stable. -follow-up echo -follow-up PT/OT -orthostatic vitals -follow-up blood culture  UTI -continue ceftriaxone -follow-up urine culture  Acute encephalopathy Patient confused but unsure of baseline. Answers some questions appropriately. Possibly related to UTI.  History of stroke -continue aspirin -continue crestor  Seizures -continue Keppra -keppra level pending -seizure precautions  Hypertension -continue metoprolol -continue to hold spironolactone -hydralazine prn  Hyperlipidemia -continue crestor  Diabetes mellitus -continue Lantus 7u daily -continue SSI -hemoglobin A1C pending  Atrial fibrillation TSH normal -aspirin -follow-up echocardiogram   DVT prophylaxis: Lovenox Code Status: Full code Family Communication: None at bedside Disposition Plan: Likely discharge in 2-3 days   Consultants:   None  Procedures:  None  Antimicrobials:  Ceftriaxone (10/18>>    Subjective: No reports of pain  Objective: Vitals:   01/26/16 0100 01/26/16 0213 01/26/16 0601 01/26/16 1400  BP: 129/67 (!) 160/74 (!) 144/70 115/74  Pulse: 73 (!) 59 60 77  Resp: 22 18 18 18     Temp:  97.5 F (36.4 C) 98.2 F (36.8 C) 97.5 F (36.4 C)  TempSrc:  Oral Oral Oral  SpO2: 100% 92% 100% 98%  Weight:  65.5 kg (144 lb 4.8 oz)    Height:  5\' 5"  (1.651 m)      Intake/Output Summary (Last 24 hours) at 01/26/16 1551 Last data filed at 01/26/16 1441  Gross per 24 hour  Intake           783.75 ml  Output                0 ml  Net           783.75 ml   Filed Weights   01/26/16 0213  Weight: 65.5 kg (144 lb 4.8 oz)    Examination:  General exam: Appears calm and comfortable  Respiratory system: Clear to auscultation. Respiratory effort normal. Cardiovascular system: S1 & S2 heard, RRR. No murmurs, rubs, gallops or clicks. Gastrointestinal system: Abdomen is nondistended, soft and nontender. No organomegaly or masses felt. Normal bowel sounds heard. Central nervous system: Alert and oriented to person. Extremities: No edema. No calf tenderness Skin: No cyanosis. No rashes Psychiatry: Judgement and insight appear impaired    Data Reviewed: I have personally reviewed following labs and imaging studies  CBC:  Recent Labs Lab 01/25/16 2124 01/26/16 0737  WBC 9.5 10.4  HGB 15.0 14.6  HCT 44.0 42.9  MCV 87.3 87.4  PLT 137* 131*   Basic Metabolic Panel:  Recent Labs Lab 01/25/16 2153 01/26/16 0737  NA 140 140  K 3.9 4.0  CL 112* 114*  CO2 21* 18*  GLUCOSE 119* 98  BUN 21* 23*  CREATININE 2.02* 1.83*  CALCIUM 9.3 9.1   GFR: Estimated Creatinine  Clearance: 21 mL/min (by C-G formula based on SCr of 1.83 mg/dL (H)). Liver Function Tests:  Recent Labs Lab 01/25/16 2153  AST 19  ALT 13*  ALKPHOS 112  BILITOT 1.1  PROT 6.8  ALBUMIN 2.8*   No results for input(s): LIPASE, AMYLASE in the last 168 hours. No results for input(s): AMMONIA in the last 168 hours. Coagulation Profile:  Recent Labs Lab 01/25/16 2153  INR 1.02   Cardiac Enzymes:  Recent Labs Lab 01/26/16 0442 01/26/16 0737 01/26/16 1256  TROPONINI 0.03* 0.03* 0.04*    BNP (last 3 results) No results for input(s): PROBNP in the last 8760 hours. HbA1C: No results for input(s): HGBA1C in the last 72 hours. CBG:  Recent Labs Lab 01/25/16 2345 01/26/16 0218 01/26/16 0819 01/26/16 1157  GLUCAP 111* 122* 126* 161*   Lipid Profile:  Recent Labs  01/26/16 0446  CHOL 136  HDL 32*  LDLCALC 87  TRIG 83  CHOLHDL 4.3   Thyroid Function Tests:  Recent Labs  01/26/16 0737  TSH 2.273   Anemia Panel: No results for input(s): VITAMINB12, FOLATE, FERRITIN, TIBC, IRON, RETICCTPCT in the last 72 hours. Sepsis Labs:  Recent Labs Lab 01/25/16 2221  LATICACIDVEN 1.71    Recent Results (from the past 240 hour(s))  Culture, blood (Routine X 2) w Reflex to ID Panel     Status: None (Preliminary result)   Collection Time: 01/26/16  7:20 AM  Result Value Ref Range Status   Specimen Description BLOOD LEFT HAND  Final   Special Requests IN PEDIATRIC BOTTLE  [facility]  Final   Culture PENDING  Incomplete   Report Status PENDING  Incomplete         Radiology Studies: Ct Head Wo Contrast  Result Date: 01/25/2016 CLINICAL DATA:  Found unresponsive. Speech disturbance. History of seizures. Left facial droop and left-sided weakness. EXAM: CT HEAD WITHOUT CONTRAST TECHNIQUE: Contiguous axial images were obtained from the base of the skull through the vertex without intravenous contrast. COMPARISON:  None. FINDINGS: Brain: No brainstem lesion is seen. There is cerebellar atrophy. There is an old infarction in the right middle cerebral artery territory affecting the insula, lateral basal ganglia and frontoparietal cortical and subcortical brain, with progression to atrophy and encephalomalacia. There are chronic small-vessel white matter ischemic changes in both hemispheres. No sign of acute infarction, mass lesion, hemorrhage, hydrocephalus or extra-axial collection. Vascular: There is atherosclerotic calcification of the major vessels at the base of the brain.  Skull: No fracture. Sinuses/Orbits: Sinuses are clear except for small amount of fluid in the left maxillary sinus. Other: None significant IMPRESSION: No acute finding by CT. Old infarction in the right MCA territory. Chronic small-vessel ischemic changes. Generalized brain atrophy. Electronically Signed   By: Paulina Fusi M.D.   On: 01/25/2016 23:03   Dg Chest Portable 1 View  Result Date: 01/25/2016 CLINICAL DATA:  Loss of consciousness today.  History of seizures. EXAM: PORTABLE CHEST 1 VIEW COMPARISON:  None. FINDINGS: There is left ventricular prominence. Pacemaker AICD is in place. There is aortic atherosclerosis. The pulmonary vascularity is normal. The lungs are clear. No effusions. No acute bone finding. IMPRESSION: No active disease. Left ventricular prominence. AICD. Aortic atherosclerosis. Electronically Signed   By: Paulina Fusi M.D.   On: 01/25/2016 23:00        Scheduled Meds: . [START ON 01/27/2016] aspirin  81 mg Oral Daily  . [START ON 01/27/2016] calcitRIOL  0.25 mcg Oral Once per day on Mon Wed Fri  .  cefTRIAXone (ROCEPHIN)  IV  1 g Intravenous Daily  . chlorhexidine  15 mL Mouth Rinse BID  . [START ON 01/27/2016] enoxaparin (LOVENOX) injection  30 mg Subcutaneous Q24H  . insulin aspart  0-9 Units Subcutaneous TID WC  . insulin glargine  7 Units Subcutaneous QHS  . levETIRAcetam  1,000 mg Oral BID  . mouth rinse  15 mL Mouth Rinse q12n4p  . metoprolol succinate  25 mg Oral Daily  . rosuvastatin  10 mg Oral q1800  . sodium chloride flush  3 mL Intravenous Q12H   Continuous Infusions: . sodium chloride 75 mL/hr at 01/26/16 0237     LOS: 0 days     Jacquelin HawkingRalph Markisha Meding [facility] 01/26/2016, 3:51 PM Pager: [phone]  If 7PM-7AM, please contact night-coverage [email or web address] Password [email or web address] 01/26/2016, 3:51 PM

## 2016-01-26 NOTE — ED Notes (Signed)
Per Dr. Clyde LundborgNiu pt is not to be moved up to inpt room until pacemaker is interrogated

## 2016-01-26 NOTE — Progress Notes (Signed)
Patient unable to stand up to check BP and Pulse. Will continue to monitor.

## 2016-01-26 NOTE — Progress Notes (Signed)
Pt admitted to 5w09 from ED. Pt lives at home with family. Pt A&Ox1 with expressive asphasia. No family at bedside. Pt has bruising and abrasions to bilateral legs. Pt placed on bed alarm and told to call for help. Will continue to monitor pt. Nelda MarseilleJenny thacker, RN

## 2016-01-26 NOTE — Progress Notes (Signed)
CRITICAL VALUE ALERT  Critical value received: Troponin 0.03  Date of notification:  01/26/16  Time of notification:  0701  Critical value read back:Yes.    Nurse who received alert:  Nelda MarseilleJenny Thacker, RN  MD notified (1st page):  Dr. Clyde LundborgNiu  Time of first page:  0705  MD notified (2nd page):  Time of second page:  Responding MD:  Dr. Clyde LundborgNiu  Time MD responded: [phone] No new orders given.

## 2016-01-26 NOTE — Evaluation (Signed)
Clinical/Bedside Swallow Evaluation Patient Details  Name: Dawn CaneVirginia Gaines MRN: [id number] Date of Birth: 07/05/1930  Today's Date: 01/26/2016 Time: SLP Start Time (ACUTE ONLY): [id number] SLP Stop Time (ACUTE ONLY): 0846 SLP Time Calculation (min) (ACUTE ONLY): 18 min  Past Medical History:  Past Medical History:  Diagnosis Date  . Alzheimer disease    per family  . Diabetes mellitus without complication (HCC)   . Hypercholesteremia   . Hypertension   . Pacemaker   . Seizures (HCC)   . Stroke [facility](HCC)    Past Surgical History:  Past Surgical History:  Procedure Laterality Date  . PERMANENT PACEMAKER INSERTION     HPI:  Dawn Gaines a 82 y.o.femalewith medical history significant of Alzheimer's disease, hypertension, hyperlipidemia, diabetes mellitus, stroke with left facial droop and mild left-sided weakness at the baseline, seizure on Keppra, s/p of AICD placement, who presents with altered mental status, syncope and fall.Found to have UTI. CXR clear.    Assessment / Plan / Recommendation Clinical Impression  Pt demonstrates a mild oral dysphagia with slightly weak labial closure and mildly prolonged transit of thins and purees (5 seconds). Pt is able to masticate regular solids with top denture only with extra time and liquid wash. States she wants a regular diet and demosntrates ability to manage it. Recommend a regular diet and thin liquids. No SLP f/u needed will sign off.     Aspiration Risk  Mild aspiration risk    Diet Recommendation Regular;Thin liquid   Liquid Administration via: Cup;Straw Supervision: Patient able to self feed Compensations: Slow rate;Small sips/bites Postural Changes: Seated upright at 90 degrees    Other  Recommendations Oral Care Recommendations: Oral care BID     Swallow Study   General HPI: Dawn Gaines a 82 y.o.femalewith medical history significant of Alzheimer's disease, hypertension, hyperlipidemia, diabetes mellitus, stroke  with left facial droop and mild left-sided weakness at the baseline, seizure on Keppra, s/p of AICD placement, who presents with altered mental status, syncope and fall.Found to have UTI. CXR clear.  Type of Study: Bedside Swallow Evaluation Previous Swallow Assessment: none in [place] Diet Prior to this Study: NPO Temperature Spikes Noted: No Respiratory Status: Room air History of Recent Intubation: No Behavior/Cognition: Alert;Cooperative;Pleasant mood Oral Cavity Assessment: Within Functional Limits Oral Care Completed by SLP: No Oral Cavity - Dentition: Dentures, top;Edentulous Vision: Functional for self-feeding Self-Feeding Abilities: Able to feed self Patient Positioning: Upright in bed Baseline Vocal Quality: Normal Volitional Cough: Strong Volitional Swallow: Able to elicit    Oral/Motor/Sensory Function Overall Oral Motor/Sensory Function: Mild impairment Lingual Strength: Reduced   Ice Chips Ice chips: Not tested   Thin Liquid Thin Liquid: Impaired Presentation: Cup;Straw;Self Fed Oral Phase Impairments: Reduced labial seal    Nectar Thick Nectar Thick Liquid: Not tested   Honey Thick Honey Thick Liquid: Not tested   Puree Puree: Impaired Presentation: Spoon;Self Fed Oral Phase Functional Implications: Prolonged oral transit   Solid   GO   Solid: Impaired Presentation: Self Fed Oral Phase Functional Implications: Prolonged oral transit       Dawn DittyBonnie Kally Cadden, MA CCC-SLP [phone]  Dawn Gaines, Dawn Gaines 01/26/2016,9:01 AM

## 2016-01-27 ENCOUNTER — Inpatient Hospital Stay (HOSPITAL_COMMUNITY): Payer: Medicare Other

## 2016-01-27 ENCOUNTER — Other Ambulatory Visit (HOSPITAL_COMMUNITY): Payer: Medicare Other

## 2016-01-27 ENCOUNTER — Encounter (HOSPITAL_COMMUNITY): Payer: Self-pay | Admitting: General Practice

## 2016-01-27 DIAGNOSIS — R55 Syncope and collapse: Secondary | ICD-10-CM

## 2016-01-27 DIAGNOSIS — N3000 Acute cystitis without hematuria: Secondary | ICD-10-CM

## 2016-01-27 DIAGNOSIS — I4891 Unspecified atrial fibrillation: Secondary | ICD-10-CM

## 2016-01-27 LAB — ECHOCARDIOGRAM COMPLETE
CHL CUP RV SYS PRESS: 29 mmHg
FS: 24 % — AB (ref 28–44)
HEIGHTINCHES: 65 in
IVS/LV PW RATIO, ED: 0.89
LA ID, A-P, ES: 43 mm
LA diam end sys: 43 mm
LA vol A4C: 57.8 ml
LA vol index: 32.1 mL/m2
LA vol: 56.1 mL
LADIAMINDEX: 2.46 cm/m2
LVOT area: 3.46 cm2
LVOT diameter: 21 mm
PW: 11.9 mm — AB (ref 0.6–1.1)
RV TAPSE: 13.5 mm
Reg peak vel: 255 cm/s
TR max vel: 255 cm/s
Weight: 2409.6 oz

## 2016-01-27 LAB — GLUCOSE, CAPILLARY
GLUCOSE-CAPILLARY: 83 mg/dL (ref 65–99)
GLUCOSE-CAPILLARY: 141 mg/dL — AB (ref 65–99)
Glucose-Capillary: 120 mg/dL — ABNORMAL HIGH (ref 65–99)
Glucose-Capillary: 141 mg/dL — ABNORMAL HIGH (ref 65–99)

## 2016-01-27 LAB — UREA NITROGEN, URINE: Urea Nitrogen, Ur: 778 mg/dL

## 2016-01-27 LAB — HEMOGLOBIN A1C
HEMOGLOBIN A1C: 5.6 % (ref 4.8–5.6)
MEAN PLASMA GLUCOSE: 114 mg/dL

## 2016-01-27 MED ORDER — CEPHALEXIN 250 MG PO CAPS: 250.0000 mg | ORAL_CAPSULE | Freq: Two times a day (BID) | ORAL | 0 refills | Status: AC

## 2016-01-27 MED ORDER — METOPROLOL SUCCINATE ER 25 MG PO TB24: 25.0000 mg | ORAL_TABLET | Freq: Every day | ORAL | 0 refills | Status: AC

## 2016-01-27 MED ORDER — INSULIN GLARGINE 100 UNIT/ML SOLOSTAR PEN: 7.0000 [IU] | PEN_INJECTOR | Freq: Every day | SUBCUTANEOUS | 0 refills | Status: AC

## 2016-01-27 MED ORDER — ASPIRIN 81 MG PO CHEW: 81.0000 mg | CHEWABLE_TABLET | Freq: Every day | ORAL | 0 refills | Status: AC

## 2016-01-27 NOTE — Progress Notes (Signed)
Physical Therapy Treatment Patient Details Name: Dawn Gaines MRN: [id number] DOB: 10-Jul-1930 Today's Date: 01/27/2016    History of Present Illness Dawn Gaines is a 82 y.o. female with a history of Alzheimer's disease, hypertension, hyperlipidemia, diabetes mellitus, stroke with left facial droop and mild left-sided weakness at the baseline, seizure on Keppra, s/p of AICD placement, who presents with altered mental status, syncope and fall    PT Comments    Pt required max A +2 for mobility this session. PLOF still unclear as pt reported she lives with son however CM note reported she lives with daughter. Pt continues to need 24 hour assist and HHPT to maximize independence and safety with mobility upon d/c.   Follow Up Recommendations  Home health PT;Supervision/Assistance - 24 hour     Equipment Recommendations  Wheelchair (measurements PT);Wheelchair cushion (measurements PT)    Recommendations for Other Services       Precautions / Restrictions Precautions Precautions: Fall    Mobility  Bed Mobility Overal bed mobility: Needs Assistance Bed Mobility: Sit to Supine       Sit to supine: Max assist;+2 for physical assistance   General bed mobility comments: assist to pull hips onto bed with bed pad as pt was very close to EOB due to posterior lean upon sitting with bilat LE extended; max cues for sequencing to get in safe position for returning to supine; assist required to bring bilat LE into bed and position trunk; pt began lying down with her head toward the foot of the bed and needed redirected  Transfers Overall transfer level: Needs assistance Equipment used: Rolling walker (2 wheeled) Transfers: Stand Pivot Transfers   Stand pivot transfers: Max assist;+2 physical assistance       General transfer comment: max cues for safe hand placement and technique and use of AD; assist to mangae RW and pivot bilat feet; pt with flexed trunk and difficulty with step  height   Ambulation/Gait             General Gait Details: unable   Stairs            Wheelchair Mobility    Modified Rankin (Stroke Patients Only)       Balance     Sitting balance-Leahy Scale: Fair       Standing balance-Leahy Scale: Poor                      Cognition Arousal/Alertness: Awake/alert Behavior During Therapy: Flat affect Overall Cognitive Status: No family/caregiver present to determine baseline cognitive functioning                      Exercises      General Comments        Pertinent Vitals/Pain Pain Assessment: Faces Faces Pain Scale: No hurt    Home Living                      Prior Function            PT Goals (current goals can now be found in the care plan section) Acute Rehab PT Goals Patient Stated Goal: None stated Progress towards PT goals: Progressing toward goals    Frequency    Min 3X/week      PT Plan Current plan remains appropriate    Co-evaluation             End of Session Equipment Utilized During Treatment: Gait belt Activity  Tolerance: Patient tolerated treatment well Patient left: in bed;with call bell/phone within reach;with bed alarm set     Time: [id number] PT Time Calculation (min) (ACUTE ONLY): 19 min  Charges:  $Therapeutic Activity: 8-22 mins                    G Codes:      Derek MoundKellyn R Koltin Wehmeyer Persais Ethridge, PTA Pager: [phone]   01/27/2016, 3:50 PM

## 2016-01-27 NOTE — Progress Notes (Signed)
PROGRESS NOTE    [place]  ZOX:[id number] DOB: [birth year] DOA: 01/25/2016 PCP: No primary care provider on file.   Brief Narrative: Rondia Higginbotham is a 82 y.o. female with a history of Alzheimer's disease, hypertension, hyperlipidemia, diabetes mellitus, stroke with left facial droop and mild left-sided weakness at the baseline, seizure on Keppra, s/p of AICD placement, who presents with altered mental status, syncope and fall.   Assessment & Plan:   Principal Problem:   Syncope Active Problems:   Stroke (HCC)   Seizures (HCC)   Pacemaker   Hypertension   Hypercholesteremia   Diabetes mellitus without complication (HCC)   Alzheimer disease   Fall   Atrial fibrillation (HCC)   UTI (urinary tract infection)   Syncope Unclear etiology. Troponin slightly elevated and stable. No chest pain. EKG not suggestive of ACS. PT/OT recommend home health -follow-up echo -follow-up blood culture  UTI -continue ceftriaxone -follow-up urine culture  Acute encephalopathy Patient confused but unsure of baseline. Answers some questions appropriately. Improved. Possibly related to UTI.  History of stroke -continue aspirin -continue crestor  Seizures -continue Keppra -keppra level pending -seizure precautions  Hypertension -continue metoprolol -continue to hold spironolactone -hydralazine prn  Hyperlipidemia -continue crestor  Diabetes mellitus -continue Lantus 7u daily -continue SSI -hemoglobin A1C pending  Atrial fibrillation TSH normal -aspirin -follow-up echocardiogram   DVT prophylaxis: Lovenox Code Status: Full code Family Communication: None at bedside Disposition Plan: Likely discharge in 2-3 days   Consultants:   None  Procedures:  None  Antimicrobials:  Ceftriaxone (10/18>>    Subjective: No reports of pain today  Objective: Vitals:   01/26/16 2335 01/27/16 0500 01/27/16 0620 01/27/16 1041  BP: (!) 157/71  (!) 160/71 (!) 150/68    Pulse: 60  60   Resp: 16  16   Temp: 98.1 F (36.7 C)  97.8 F (36.6 C)   TempSrc: Oral  Oral   SpO2: 100%  98%   Weight:  68.3 kg (150 lb 9.6 oz)    Height:        Intake/Output Summary (Last 24 hours) at 01/27/16 1513 Last data filed at 01/27/16 0700  Gross per 24 hour  Intake             1785 ml  Output                0 ml  Net             1785 ml   Filed Weights   01/26/16 0213 01/27/16 0500  Weight: 65.5 kg (144 lb 4.8 oz) 68.3 kg (150 lb 9.6 oz)    Examination:  General exam: Appears calm and comfortable  Respiratory system: Clear to auscultation. Respiratory effort normal. Cardiovascular system: S1 & S2 heard, RRR. No murmurs, rubs, gallops or clicks. Gastrointestinal system: Abdomen is nondistended, soft and nontender. Normal bowel sounds heard. Central nervous system: Alert and oriented to person only Extremities: No edema. No calf tenderness Skin: No cyanosis. No rashes Psychiatry: Judgement and insight appear impaired    Data Reviewed: I have personally reviewed following labs and imaging studies  CBC:  Recent Labs Lab 01/25/16 2124 01/26/16 0737  WBC 9.5 10.4  HGB 15.0 14.6  HCT 44.0 42.9  MCV 87.3 87.4  PLT 137* 131*   Basic Metabolic Panel:  Recent Labs Lab 01/25/16 2153 01/26/16 0737  NA 140 140  K 3.9 4.0  CL 112* 114*  CO2 21* 18*  GLUCOSE 119* 98  BUN 21* 23*  CREATININE 2.02* 1.83*  CALCIUM 9.3 9.1   GFR: Estimated Creatinine Clearance: 21 mL/min (by C-G formula based on SCr of 1.83 mg/dL (H)). Liver Function Tests:  Recent Labs Lab 01/25/16 2153  AST 19  ALT 13*  ALKPHOS 112  BILITOT 1.1  PROT 6.8  ALBUMIN 2.8*   No results for input(s): LIPASE, AMYLASE in the last 168 hours. No results for input(s): AMMONIA in the last 168 hours. Coagulation Profile:  Recent Labs Lab 01/25/16 2153  INR 1.02   Cardiac Enzymes:  Recent Labs Lab 01/26/16 0442 01/26/16 0737 01/26/16 1256 01/26/16 1929  TROPONINI 0.03*  0.03* 0.04* 0.03*   BNP (last 3 results) No results for input(s): PROBNP in the last 8760 hours. HbA1C:  Recent Labs  01/26/16 0446  HGBA1C 5.6   CBG:  Recent Labs Lab 01/26/16 1157 01/26/16 1709 01/26/16 2326 01/27/16 0829 01/27/16 1159  GLUCAP 161* 85 122* 83 120*   Lipid Profile:  Recent Labs  01/26/16 0446  CHOL 136  HDL 32*  LDLCALC 87  TRIG 83  CHOLHDL 4.3   Thyroid Function Tests:  Recent Labs  01/26/16 0737  TSH 2.273   Anemia Panel: No results for input(s): VITAMINB12, FOLATE, FERRITIN, TIBC, IRON, RETICCTPCT in the last 72 hours. Sepsis Labs:  Recent Labs Lab 01/25/16 2221  LATICACIDVEN 1.71    Recent Results (from the past 240 hour(s))  Urine culture     Status: Abnormal (Preliminary result)   Collection Time: 01/25/16  9:24 PM  Result Value Ref Range Status   Specimen Description URINE, RANDOM  Final   Special Requests NONE  Final   Culture >=100,000 COLONIES/mL ESCHERICHIA COLI (A)  Final   Report Status PENDING  Incomplete  Culture, blood (Routine X 2) w Reflex to ID Panel     Status: None (Preliminary result)   Collection Time: 01/26/16  7:20 AM  Result Value Ref Range Status   Specimen Description BLOOD LEFT HAND  Final   Special Requests IN PEDIATRIC BOTTLE  2CC  Final   Culture NO GROWTH 1 DAY  Final   Report Status PENDING  Incomplete  Culture, blood (Routine X 2) w Reflex to ID Panel     Status: None (Preliminary result)   Collection Time: 01/26/16  7:35 AM  Result Value Ref Range Status   Specimen Description BLOOD LEFT ARM  Final   Special Requests IN PEDIATRIC BOTTLE  2CC  Final   Culture NO GROWTH 1 DAY  Final   Report Status PENDING  Incomplete         Radiology Studies: Ct Head Wo Contrast  Result Date: 01/25/2016 CLINICAL DATA:  Found unresponsive. Speech disturbance. History of seizures. Left facial droop and left-sided weakness. EXAM: CT HEAD WITHOUT CONTRAST TECHNIQUE: Contiguous axial images were obtained  from the base of the skull through the vertex without intravenous contrast. COMPARISON:  None. FINDINGS: Brain: No brainstem lesion is seen. There is cerebellar atrophy. There is an old infarction in the right middle cerebral artery territory affecting the insula, lateral basal ganglia and frontoparietal cortical and subcortical brain, with progression to atrophy and encephalomalacia. There are chronic small-vessel white matter ischemic changes in both hemispheres. No sign of acute infarction, mass lesion, hemorrhage, hydrocephalus or extra-axial collection. Vascular: There is atherosclerotic calcification of the major vessels at the base of the brain. Skull: No fracture. Sinuses/Orbits: Sinuses are clear except for small amount of fluid in the left maxillary sinus. Other: None significant IMPRESSION: No acute finding by CT.  Old infarction in the right MCA territory. Chronic small-vessel ischemic changes. Generalized brain atrophy. Electronically Signed   By: Paulina FusiMark  Shogry M.D.   On: 01/25/2016 23:03   Dg Chest Portable 1 View  Result Date: 01/25/2016 CLINICAL DATA:  Loss of consciousness today.  History of seizures. EXAM: PORTABLE CHEST 1 VIEW COMPARISON:  None. FINDINGS: There is left ventricular prominence. Pacemaker AICD is in place. There is aortic atherosclerosis. The pulmonary vascularity is normal. The lungs are clear. No effusions. No acute bone finding. IMPRESSION: No active disease. Left ventricular prominence. AICD. Aortic atherosclerosis. Electronically Signed   By: Paulina FusiMark  Shogry M.D.   On: 01/25/2016 23:00        Scheduled Meds: . aspirin  81 mg Oral Daily  . calcitRIOL  0.25 mcg Oral Once per day on Mon Wed Fri  . cefTRIAXone (ROCEPHIN)  IV  1 g Intravenous Daily  . chlorhexidine  15 mL Mouth Rinse BID  . enoxaparin (LOVENOX) injection  30 mg Subcutaneous Q24H  . insulin aspart  0-9 Units Subcutaneous TID WC  . insulin glargine  7 Units Subcutaneous QHS  . levETIRAcetam  1,000 mg  Oral BID  . mouth rinse  15 mL Mouth Rinse q12n4p  . metoprolol succinate  25 mg Oral Daily  . rosuvastatin  10 mg Oral q1800  . sodium chloride flush  3 mL Intravenous Q12H   Continuous Infusions: . sodium chloride 1,000 mL (01/26/16 1623)     LOS: 1 day     Jacquelin Hawkingalph Hermena Swint [facility] 01/27/2016, 3:13 PM Pager: [phone]  If 7PM-7AM, please contact night-coverage [email or web address] Password [email or web address] 01/27/2016, 3:13 PM

## 2016-01-27 NOTE — Progress Notes (Signed)
After speaking with care management felt confident patient's home care needs could be met and discharge could happen tonight. Care manager then spoke to daughter and daughter told me she could not be discharged until Saturday due to care manager not being able to set up Home health.

## 2016-01-27 NOTE — Care Management Note (Addendum)
Case Management Note  Patient Details  Name: Dawn Gaines MRN: [id number] Date of Birth: 11/25/1930  Subjective/Objective:                 Patient to return home with daughter at discharge. CM spoke to daughter, [facility], she defers HHA at this time. She states her mother has a walker and a wheelchair, but would need a hospital bed. [facility]  [facility] [address]   Action/Plan:  Needs DC info and [facility]  10-20 1610 [facility]ed [facility] at [phone]    Expected Discharge Date:                  Expected Discharge Plan:  Home w Home Health Services  In-House Referral:  NA  Discharge planning Services  CM Consult  Post Acute Care Choice:  Durable Medical Equipment, Home Health Choice offered to:  Adult Children  DME Arranged:  Hospital bed DME Agency:  [facility]  HH Arr[facility]  [facility]  Status of Service:  In process, will continue to follow  If discussed at Long Length of Stay Meetings, dates discussed:    Additional Comments:  Lawerance SabalDebbie Camauri Craton, RN 01/27/2016, 2:58 PM

## 2016-01-27 NOTE — Progress Notes (Signed)
  Echocardiogram 2D Echocardiogram has been performed.  Delcie RochENNINGTON, Zamira Hickam 01/27/2016, 5:13 PM

## 2016-01-27 NOTE — Evaluation (Signed)
Occupational Therapy Evaluation Patient Details Name: Dawn Gaines MRN: [id number] DOB: [birth year] Today's Date: 01/27/2016    History of Present Illness Wrenn Willcox is a 82 y.o. female with a history of Alzheimer's disease, hypertension, hyperlipidemia, diabetes mellitus, stroke with left facial droop and mild left-sided weakness at the baseline, seizure on Keppra, s/p of AICD placement, who presents with altered mental status, syncope and fall   Clinical Impression   Pt admitted with above. She demonstrates the below listed deficits and will benefit from continued OT to maximize safety and independence with BADLs.  Pt presents to OT with impaired cognition, decreased activity tolerance, impaired balance, poor safety awareness.  She requires max A for pivot transfers and max - total A, overall with ADLs.  No family present during eval, and limited info in chart re: PLOF.  Unsure if family can provide current level of assistance as she require extensive physical assistance.  IF they are unable to provide this level of care, recommend SNF.         Follow Up Recommendations  SNF;Supervision/Assistance - 24 hour (HHOT if family able to provide current level of assist)   Equipment Recommendations  3 in 1 bedside comode;Hospital bed;Other (comment) (hoyer lift )    Recommendations for Other Services       Precautions / Restrictions Precautions Precautions: Fall Restrictions Weight Bearing Restrictions: No      Mobility Bed Mobility Overal bed mobility: Needs Assistance Bed Mobility: Supine to Sit     Supine to sit: Min assist;HOB elevated     General bed mobility comments: heavy reliance on bedrails and pt requires assist to lift trunk.  She automatically reaches for therapist to assist her, and is unable to problem solve how to perform without therapist assisting   Transfers Overall transfer level: Needs assistance   Transfers: Sit to/from Stand;Stand Pivot  Transfers Sit to Stand: Max assist   Squat pivot transfers: Max assist     General transfer comment: Pt will initiate standing, but does not fully extend hops and knees.  She pushes heavily posteriorly requiring max A for all aspects of transfer     Balance Overall balance assessment: Needs assistance Sitting-balance support: Feet supported Sitting balance-Leahy Scale: Fair     Standing balance support: Bilateral upper extremity supported Standing balance-Leahy Scale: Poor Standing balance comment: Pt requires max A                             ADL Overall ADL's : Needs assistance/impaired Eating/Feeding: Minimal assistance;Bed level;Sitting   Grooming: Wash/dry hands;Wash/dry face;Oral care;Brushing hair;Moderate assistance;Sitting   Upper Body Bathing: Maximal assistance;Sitting   Lower Body Bathing: Total assistance;Sit to/from stand   Upper Body Dressing : Total assistance;Sitting   Lower Body Dressing: Total assistance;Sit to/from stand   Toilet Transfer: Maximal assistance;Stand-pivot;BSC   Toileting- Clothing Manipulation and Hygiene: Total assistance;Sit to/from stand       Functional mobility during ADLs: Maximal assistance General ADL Comments: Pt requires assist to initiate activity and to stay on task.  When standing she pushes heavily posteriorly requiring max A to stand and maintain balance      Vision Additional Comments: Pt unable to participate in visual assessment due to cognitive impairment    Perception     Praxis      Pertinent Vitals/Pain Pain Assessment: Faces Faces Pain Scale: No hurt     Hand Dominance     Extremity/Trunk Assessment Upper Extremity  Assessment Upper Extremity Assessment: Generalized weakness;Difficult to assess due to impaired cognition   Lower Extremity Assessment Lower Extremity Assessment: Defer to PT evaluation   Cervical / Trunk Assessment Cervical / Trunk Assessment: Kyphotic   Communication  Communication Communication: Expressive difficulties   Cognition Arousal/Alertness: Awake/alert Behavior During Therapy: Flat affect Overall Cognitive Status: No family/caregiver present to determine baseline cognitive functioning                     General Comments       Exercises       Shoulder Instructions      Home Living Family/patient expects to be discharged to:: Private residence   Available Help at Discharge: Family                             Additional Comments: Per chart, pt is from home, but no other information in the chart, and pt unable to provide info.       Prior Functioning/Environment Level of Independence: Needs assistance  Gait / Transfers Assistance Needed: chart notes recent falls ADL's / Homemaking Assistance Needed: chart noted previously dependent for ADL   Comments: Limited info in chart re: PLOF and pt unable to provide info         OT Problem List: Decreased strength;Decreased activity tolerance;Impaired balance (sitting and/or standing);Decreased cognition;Decreased safety awareness;Decreased knowledge of use of DME or AE;Obesity   OT Treatment/Interventions: Self-care/ADL training;Therapeutic exercise;Energy conservation;DME and/or AE instruction;Therapeutic activities;Patient/family education;Balance training    OT Goals(Current goals can be found in the care plan section) Acute Rehab OT Goals Patient Stated Goal: None stated OT Goal Formulation: Patient unable to participate in goal setting Time For Goal Achievement: 02/10/16 Potential to Achieve Goals: Fair ADL Goals Pt Will Perform Grooming: with min assist;sitting Pt Will Transfer to Toilet: with mod assist;stand pivot transfer;bedside commode  OT Frequency: Min 2X/week   Barriers to D/C: Decreased caregiver support  unsure if family able to provide max A care        Co-evaluation              End of Session Equipment Utilized During Treatment:  Gait belt Nurse Communication: Mobility status  Activity Tolerance: Patient tolerated treatment well Patient left: in chair;with call bell/phone within reach;with chair alarm set   Time: [id number] OT Time Calculation (min): 14 min Charges:  OT General Charges $OT Visit: 1 Procedure OT Evaluation $OT Eval Moderate Complexity: 1 Procedure G-Codes:    Anya Murphey M 01/27/2016, 11:59 AM

## 2016-01-27 NOTE — Discharge Summary (Signed)
Physician Discharge Summary  [name] ZOX:[id number] DOB: [birth year] DOA: 01/25/2016  PCP: No primary care provider on file.  Admit date: 01/25/2016 Discharge date: 01/27/2016  Admitted From: Home Disposition:  Home  Recommendations for Outpatient Follow-up:  1. Follow up with PCP in 1-2 weeks. Patient to establish care in [place] area 2. Please obtain BMP/CBC in one week 3. Recheck Keppra level  4. Follow-up with cardiology  Home Health: PT, OT, RN Equipment/Devices: Wheelchair, hospital bed, bedside commode, walking roller  Discharge Condition: Stable CODE STATUS: Full code  Brief/Interim Summary:  HPI written by Dr. Lorretta Harp  HPI: Dawn Gaines is a 82 y.o. female with medical history significant of Alzheimer's disease, hypertension, hyperlipidemia, diabetes mellitus, stroke with left facial droop and mild left-sided weakness at the baseline, seizure on Keppra, s/p of AICD placement, who presents with altered mental status, syncope and fall.  Pt has recently moved from [place]. Per patient's daughter-in-law, patient had syncope episode at about 6 PM. Patient was unresponsive for about 15 minutes at about 6:00 PM. No seizure activity was noted. Pt is moreconfused and yelling out. Per family, patient had a similar episode on Saturday. Recently patient has multiple falls. Per her daughter-in-law, patient has been compliant to her seizure medications Keppra. Per family, patient does not seem to have chest pain, shortness of breath, coughing, nausea, vomiting, diarrhea or abdominal pain. Not sure if she has symptoms of UTI. Patient moves all extremities. Patient does not seem to have vision or hearing loss. Pacemaker interrogation was done in ED, which did not have events.  ED Course: pt was found to have WBC 9.5, negative UDS, lactate 1.71, INR 1.02, PTT 28, creatinine 2.02, bradycardia, temperature normal. Negative chest x-ray. CT head is negative for acute intracranial  abnormalities. Pt is paced on telemetry bed for observation.   Hospital course:  Acute encephalopathy Syncope Secondary to UTI. Troponin slightly elevated and stable, likely secondary to demand ischemia. No chest pain. EKG not suggestive of ACS. PT/OT recommend home health. UTI initially treated with ceftriaxone and switched to keflex for discharge. Patient's mental status improved to baseline.   UTI As above  History of stroke Stable. Continued aspirin and crestor  Seizures Continued Keppra. No seizures witnessed while inpatient. Keppra level resulted after discharge. Patient called and instructed to follow-up with PCP (message left).  Hypertension Continued metoprolol. Spironolactone held on discharge.  Hyperlipidemia Continued crestor  Diabetes mellitus Continue Lantus 7u daily -continue SSI -hemoglobin A1C pending  Atrial fibrillation TSH normal. Continued aspirin. Echocardiogram significant for reduced EF of 30-35%  Chronic systolic congestive heart failure Compensated. Severely decreased EF of 30-35%. Hypokinesis. Cardiology consulted and will set up follow-up for outpatient management.   Discharge Diagnoses:  Principal Problem:   Syncope Active Problems:   Stroke (HCC)   Seizures (HCC)   Pacemaker   Hypertension   Hypercholesteremia   Diabetes mellitus without complication (HCC)   Alzheimer disease   Fall   Atrial fibrillation (HCC)   UTI (urinary tract infection)    Discharge Instructions     Medication List    STOP taking these medications   spironolactone 25 MG tablet Commonly known as:  ALDACTONE     TAKE these medications   aspirin 81 MG chewable tablet Chew 1 tablet (81 mg total) by mouth daily. Start taking on:  01/28/2016   calcitRIOL 0.25 MCG capsule Commonly known as:  ROCALTROL Take 0.25 mcg by mouth 3 (three) times a week. AS DIRECTED   cephALEXin 250 MG  capsule Commonly known as:  KEFLEX Take 1 capsule (250 mg total)  by mouth 2 (two) times daily. Start taking on:  01/28/2016   febuxostat 40 MG tablet Commonly known as:  ULORIC Take 40 mg by mouth daily.   Insulin Glargine 100 UNIT/ML Solostar Pen Commonly known as:  LANTUS SOLOSTAR Inject 7 Units into the skin at bedtime. What changed:  how much to take   levETIRAcetam 1000 MG tablet Commonly known as:  KEPPRA Take 1,000 mg by mouth 2 (two) times daily.   metoprolol succinate 25 MG 24 hr tablet Commonly known as:  TOPROL-XL Take 1 tablet (25 mg total) by mouth daily. Start taking on:  01/28/2016 What changed:  medication strength  how much to take  additional instructions   rosuvastatin 10 MG tablet Commonly known as:  CRESTOR Take 10 mg by mouth daily.       No Known Allergies  Consultations:  Cardiology   Procedures/Studies: Ct Head Wo Contrast  Result Date: 01/25/2016 CLINICAL DATA:  Found unresponsive. Speech disturbance. History of seizures. Left facial droop and left-sided weakness. EXAM: CT HEAD WITHOUT CONTRAST TECHNIQUE: Contiguous axial images were obtained from the base of the skull through the vertex without intravenous contrast. COMPARISON:  None. FINDINGS: Brain: No brainstem lesion is seen. There is cerebellar atrophy. There is an old infarction in the right middle cerebral artery territory affecting the insula, lateral basal ganglia and frontoparietal cortical and subcortical brain, with progression to atrophy and encephalomalacia. There are chronic small-vessel white matter ischemic changes in both hemispheres. No sign of acute infarction, mass lesion, hemorrhage, hydrocephalus or extra-axial collection. Vascular: There is atherosclerotic calcification of the major vessels at the base of the brain. Skull: No fracture. Sinuses/Orbits: Sinuses are clear except for small amount of fluid in the left maxillary sinus. Other: None significant IMPRESSION: No acute finding by CT. Old infarction in the right MCA territory.  Chronic small-vessel ischemic changes. Generalized brain atrophy. Electronically Signed   By: Paulina Fusi M.D.   On: 01/25/2016 23:03   Dg Chest Portable 1 View  Result Date: 01/25/2016 CLINICAL DATA:  Loss of consciousness today.  History of seizures. EXAM: PORTABLE CHEST 1 VIEW COMPARISON:  None. FINDINGS: There is left ventricular prominence. Pacemaker AICD is in place. There is aortic atherosclerosis. The pulmonary vascularity is normal. The lungs are clear. No effusions. No acute bone finding. IMPRESSION: No active disease. Left ventricular prominence. AICD. Aortic atherosclerosis. Electronically Signed   By: Paulina Fusi M.D.   On: 01/25/2016 23:00     Echocardiogram (01/27/2016)  Study Conclusions  - Left ventricle: The cavity size was normal. Wall thickness was   increased in a pattern of mild LVH. Systolic function was   moderately to severely reduced. The estimated ejection fraction   was in the range of 30% to 35%. Diffuse hypokinesis with   incoordinate (paced) distal septal wall motion. The study is not   technically sufficient to allow evaluation of LV diastolic   function. - Aortic valve: Calcified aortic valve. No significant stenosis,   mild regurgitation. - Mitral valve: Mildly thickened leaflets . There was mild   regurgitation. - Left atrium: The atrium was normal in size. - Right ventricle: The cavity size was mildly dilated. Pacer wire   or catheter noted in right ventricle. Systolic function was   normal. - Right atrium: The atrium was normal in size. Pacer wire or   catheter noted in right atrium. - Tricuspid valve: There was mild regurgitation. -  Pulmonary arteries: PA peak pressure: 29 mm Hg (S) + RAP. - Systemic veins: The IVC was not well visualized.  Impressions:  - LVEF 30-35%, mild LVH, severe global hypokinesis with   incoordinate distal septal motion, calcified aortic valve with   mild AI, mild MR, pacer wires noted, mild TR, RVSP 29 mmHg +  RAP.   Subjective: Patient reports no issues. Pleasant.  Discharge Exam: Vitals:   01/27/16 1041 01/27/16 1601  BP: (!) 150/68 133/60  Pulse:  60  Resp:    Temp:  97.6 F (36.4 C)   Vitals:   01/27/16 0500 01/27/16 0620 01/27/16 1041 01/27/16 1601  BP:  (!) 160/71 (!) 150/68 133/60  Pulse:  60  60  Resp:  16    Temp:  97.8 F (36.6 C)  97.6 F (36.4 C)  TempSrc:  Oral  Oral  SpO2:  98%  99%  Weight: 68.3 kg (150 lb 9.6 oz)     Height:        General exam: Appears calm and comfortable  Respiratory system: Clear to auscultation. Respiratory effort normal. Cardiovascular system: S1 & S2 heard, RRR. No murmurs, rubs, gallops or clicks. Gastrointestinal system: Abdomen is nondistended, soft and nontender. Normal bowel sounds heard. Central nervous system: Alert and oriented to person only Extremities: No edema. No calf tenderness Skin: No cyanosis. No rashes Psychiatry: Judgement and insight appear impaired  The results of significant diagnostics from this hospitalization (including imaging, microbiology, ancillary and laboratory) are listed below for reference.     Microbiology: Recent Results (from the past 240 hour(s))  Urine culture     Status: Abnormal (Preliminary result)   Collection Time: 01/25/16  9:24 PM  Result Value Ref Range Status   Specimen Description URINE, RANDOM  Final   Special Requests NONE  Final   Culture >=100,000 COLONIES/mL ESCHERICHIA COLI (A)  Final   Report Status PENDING  Incomplete  Culture, blood (Routine X 2) w Reflex to ID Panel     Status: None (Preliminary result)   Collection Time: 01/26/16  7:20 AM  Result Value Ref Range Status   Specimen Description BLOOD LEFT HAND  Final   Special Requests IN PEDIATRIC BOTTLE  2CC  Final   Culture NO GROWTH 1 DAY  Final   Report Status PENDING  Incomplete  Culture, blood (Routine X 2) w Reflex to ID Panel     Status: None (Preliminary result)   Collection Time: 01/26/16  7:35 AM  Result  Value Ref Range Status   Specimen Description BLOOD LEFT ARM  Final   Special Requests IN PEDIATRIC BOTTLE  2CC  Final   Culture NO GROWTH 1 DAY  Final   Report Status PENDING  Incomplete     Labs: Basic Metabolic Panel:  Recent Labs Lab 01/25/16 2153 01/26/16 0737  NA 140 140  K 3.9 4.0  CL 112* 114*  CO2 21* 18*  GLUCOSE 119* 98  BUN 21* 23*  CREATININE 2.02* 1.83*  CALCIUM 9.3 9.1   Liver Function Tests:  Recent Labs Lab 01/25/16 2153  AST 19  ALT 13*  ALKPHOS 112  BILITOT 1.1  PROT 6.8  ALBUMIN 2.8*   CBC:  Recent Labs Lab 01/25/16 2124 01/26/16 0737  WBC 9.5 10.4  HGB 15.0 14.6  HCT 44.0 42.9  MCV 87.3 87.4  PLT 137* 131*   Cardiac Enzymes:  Recent Labs Lab 01/26/16 0442 01/26/16 0737 01/26/16 1256 01/26/16 1929  TROPONINI 0.03* 0.03* 0.04* 0.03*  BNP: Invalid input(s): POCBNP CBG:  Recent Labs Lab 01/26/16 1709 01/26/16 2326 01/27/16 0829 01/27/16 1159 01/27/16 1752  GLUCAP 85 122* 83 120* 141*   Hgb A1c  Recent Labs  01/26/16 0446  HGBA1C 5.6   Lipid Profile  Recent Labs  01/26/16 0446  CHOL 136  HDL 32*  LDLCALC 87  TRIG 83  CHOLHDL 4.3   Thyroid function studies  Recent Labs  01/26/16 0737  TSH 2.273   Urinalysis    Component Value Date/Time   COLORURINE YELLOW 01/25/2016 2124   APPEARANCEUR CLOUDY (A) 01/25/2016 2124   LABSPEC 1.019 01/25/2016 2124   PHURINE 5.5 01/25/2016 2124   GLUCOSEU NEGATIVE 01/25/2016 2124   HGBUR NEGATIVE 01/25/2016 2124   BILIRUBINUR SMALL (A) 01/25/2016 2124   KETONESUR NEGATIVE 01/25/2016 2124   PROTEINUR 30 (A) 01/25/2016 2124   NITRITE NEGATIVE 01/25/2016 2124   LEUKOCYTESUR MODERATE (A) 01/25/2016 2124   Microbiology Recent Results (from the past 240 hour(s))  Urine culture     Status: Abnormal   Collection Time: 01/25/16  9:24 PM  Result Value Ref Range Status   Specimen Description URINE, RANDOM  Final   Special Requests NONE  Final   Culture >=100,000  COLONIES/mL ESCHERICHIA COLI (A)  Final   Report Status 01/28/2016 FINAL  Final   Organism ID, Bacteria ESCHERICHIA COLI (A)  Final      Susceptibility   Escherichia coli - MIC*    AMPICILLIN <=2 SENSITIVE Sensitive     CEFAZOLIN <=4 SENSITIVE Sensitive     CEFTRIAXONE <=1 SENSITIVE Sensitive     CIPROFLOXACIN >=4 RESISTANT Resistant     GENTAMICIN <=1 SENSITIVE Sensitive     IMIPENEM <=0.25 SENSITIVE Sensitive     NITROFURANTOIN >=512 RESISTANT Resistant     TRIMETH/SULFA <=20 SENSITIVE Sensitive     AMPICILLIN/SULBACTAM <=2 SENSITIVE Sensitive     PIP/TAZO <=4 SENSITIVE Sensitive     Extended ESBL NEGATIVE Sensitive     * >=100,000 COLONIES/mL ESCHERICHIA COLI  Culture, blood (Routine X 2) w Reflex to ID Panel     Status: None (Preliminary result)   Collection Time: 01/26/16  7:20 AM  Result Value Ref Range Status   Specimen Description BLOOD LEFT HAND  Final   Special Requests IN PEDIATRIC BOTTLE  2CC  Final   Culture NO GROWTH 3 DAYS  Final   Report Status PENDING  Incomplete  Culture, blood (Routine X 2) w Reflex to ID Panel     Status: None (Preliminary result)   Collection Time: 01/26/16  7:35 AM  Result Value Ref Range Status   Specimen Description BLOOD LEFT ARM  Final   Special Requests IN PEDIATRIC BOTTLE  2CC  Final   Culture NO GROWTH 3 DAYS  Final   Report Status PENDING  Incomplete     Time coordinating discharge: Over 30 minutes  SIGNED:   Jacquelin Hawking, MD [facility] 01/27/2016, 6:17 PM Pager [phone]  If 7PM-7AM, please contact night-coverage [email or web address] Password [email or web address]

## 2016-01-27 NOTE — Care Management (Signed)
CM spoke with Daughter-in-law Dawn Gaines [phone] regarding [facility]. Family discussed HH arrangements with Unit CM Debbie Swist earlier today, this CM confirmed HH [facility] as [facility], referral was  faxed to [phone] as fax number listed in previous CM note. [facility], [facility]  [facility] tomorrow.

## 2016-01-27 NOTE — Progress Notes (Signed)
*  PRELIMINARY RESULTS* Vascular Ultrasound Carotid Duplex (Doppler) has been completed.   Findings suggest 1-39% internal carotid artery stenosis bilaterally. Unable to visualize vertebral arteries bilaterally.  01/27/2016 4:39 PM Gertie FeyMichelle Daphna Lafuente, BS, RVT, RDCS, RDMS

## 2016-01-28 LAB — BASIC METABOLIC PANEL
Anion gap: 3 — ABNORMAL LOW (ref 5–15)
BUN: 16 mg/dL (ref 6–20)
CALCIUM: 8.5 mg/dL — AB (ref 8.9–10.3)
CHLORIDE: 117 mmol/L — AB (ref 101–111)
CO2: 20 mmol/L — AB (ref 22–32)
CREATININE: 1.67 mg/dL — AB (ref 0.44–1.00)
GFR calc Af Amer: 32 mL/min — ABNORMAL LOW (ref >60)
GFR calc non Af Amer: 27 mL/min — ABNORMAL LOW (ref >60)
GLUCOSE: 87 mg/dL (ref 65–99)
Potassium: 3.7 mmol/L (ref 3.5–5.1)
Sodium: 140 mmol/L (ref 135–145)

## 2016-01-28 LAB — URINE CULTURE

## 2016-01-28 LAB — GLUCOSE, CAPILLARY
Glucose-Capillary: 70 mg/dL (ref 65–99)
Glucose-Capillary: 131 mg/dL — ABNORMAL HIGH (ref 65–99)

## 2016-01-28 LAB — LEVETIRACETAM LEVEL: LEVETIRACETAM: 95.9 ug/mL — AB (ref 10.0–40.0)

## 2016-01-28 NOTE — Progress Notes (Signed)
CM spoke with [facility] rep, Jermaine who has explained to family insurance will not pay for another bed as they have used this benefit 2 years ago and Medicare will provide q5years.  [facility] DME rep, Reggie is delivering the wheelchair and 3n1 to room so pt can discharge.  No other CM needs were communicated.

## 2016-01-28 NOTE — Progress Notes (Signed)
Nsg Discharge Note  Admit Date:  01/25/2016 Discharge date: 01/28/2016   Dawn Gaines to be D/C'd Home per MD order.  AVS completed.  Copy for chart, and copy for patient signed, and dated. Patient/caregiver able to verbalize understanding.  Discharge Medication:   Medication List    STOP taking these medications   spironolactone 25 MG tablet Commonly known as:  ALDACTONE     TAKE these medications   aspirin 81 MG chewable tablet Chew 1 tablet (81 mg total) by mouth daily.   calcitRIOL 0.25 MCG capsule Commonly known as:  ROCALTROL Take 0.25 mcg by mouth 3 (three) times a week. AS DIRECTED   cephALEXin 250 MG capsule Commonly known as:  KEFLEX Take 1 capsule (250 mg total) by mouth 2 (two) times daily.   febuxostat 40 MG tablet Commonly known as:  ULORIC Take 40 mg by mouth daily.   Insulin Glargine 100 UNIT/ML Solostar Pen Commonly known as:  LANTUS SOLOSTAR Inject 7 Units into the skin at bedtime. What changed:  how much to take   levETIRAcetam 1000 MG tablet Commonly known as:  KEPPRA Take 1,000 mg by mouth 2 (two) times daily.   metoprolol succinate 25 MG 24 hr tablet Commonly known as:  TOPROL-XL Take 1 tablet (25 mg total) by mouth daily. What changed:  medication strength  how much to take  additional instructions   rosuvastatin 10 MG tablet Commonly known as:  CRESTOR Take 10 mg by mouth daily.       Discharge Assessment: Vitals:   01/27/16 2327 01/28/16 0652  BP: 137/77 140/68  Pulse: (!) 59 (!) 59  Resp: 16 17  Temp: 98 F (36.7 C) 97.7 F (36.5 C)   Skin clean, dry and intact without evidence of skin break down, no evidence of skin tears noted. IV catheter discontinued intact. Site without signs and symptoms of complications - no redness or edema noted at insertion site, patient denies c/o pain - only slight tenderness at site.  Dressing with slight pressure applied.  D/c Instructions-Education: Discharge instructions given to  patient/family with verbalized understanding. D/c education completed with patient/family including follow up instructions, medication list, d/c activities limitations if indicated, with other d/c instructions as indicated by MD - patient able to verbalize understanding, all questions fully answered. Patient instructed to return to ED, call 911, or call MD for any changes in condition.  Patient escorted via WC, and D/C home via private auto.  [name], RN 01/28/2016 1:27 PM

## 2016-01-29 LAB — VAS US CAROTID
LCCADSYS: -33 cm/s
LCCAPDIAS: 7 cm/s
LCCAPSYS: 39 cm/s
LEFT ECA DIAS: -6 cm/s
LICADSYS: -30 cm/s
LICAPDIAS: -11 cm/s
LICAPSYS: -32 cm/s
Left CCA dist dias: -8 cm/s
Left ICA dist dias: -7 cm/s
RCCAPSYS: 29 cm/s
RIGHT ECA DIAS: -7 cm/s
Right CCA prox dias: 5 cm/s
Right cca dist sys: -23 cm/s

## 2016-01-31 LAB — CULTURE, BLOOD (ROUTINE X 2)
Culture: NO GROWTH
Culture: NO GROWTH

## 2016-07-08 ENCOUNTER — Inpatient Hospital Stay (HOSPITAL_COMMUNITY)
Admission: EM | Admit: 2016-07-08 | Discharge: 2016-07-10 | DRG: 194 | Disposition: A | Discharge status: Home Health | Payer: Medicare Other | Attending: Family Medicine | Admitting: Family Medicine

## 2016-07-08 ENCOUNTER — Emergency Department (HOSPITAL_COMMUNITY): Payer: Medicare Other

## 2016-07-08 ENCOUNTER — Encounter (HOSPITAL_COMMUNITY): Payer: Self-pay | Admitting: Family Medicine

## 2016-07-08 DIAGNOSIS — R569 Unspecified convulsions: Secondary | ICD-10-CM | POA: Diagnosis not present

## 2016-07-08 DIAGNOSIS — E7800 Pure hypercholesterolemia, unspecified: Secondary | ICD-10-CM | POA: Diagnosis present

## 2016-07-08 DIAGNOSIS — J189 Pneumonia, unspecified organism: Secondary | ICD-10-CM | POA: Diagnosis present

## 2016-07-08 DIAGNOSIS — E119 Type 2 diabetes mellitus without complications: Secondary | ICD-10-CM

## 2016-07-08 DIAGNOSIS — Z794 Long term (current) use of insulin: Secondary | ICD-10-CM | POA: Diagnosis not present

## 2016-07-08 DIAGNOSIS — Z79899 Other long term (current) drug therapy: Secondary | ICD-10-CM

## 2016-07-08 DIAGNOSIS — G8929 Other chronic pain: Secondary | ICD-10-CM | POA: Diagnosis present

## 2016-07-08 DIAGNOSIS — I482 Chronic atrial fibrillation: Secondary | ICD-10-CM | POA: Diagnosis present

## 2016-07-08 DIAGNOSIS — Y95 Nosocomial condition: Secondary | ICD-10-CM | POA: Diagnosis present

## 2016-07-08 DIAGNOSIS — Z950 Presence of cardiac pacemaker: Secondary | ICD-10-CM | POA: Diagnosis not present

## 2016-07-08 DIAGNOSIS — E1122 Type 2 diabetes mellitus with diabetic chronic kidney disease: Secondary | ICD-10-CM | POA: Diagnosis present

## 2016-07-08 DIAGNOSIS — R109 Unspecified abdominal pain: Secondary | ICD-10-CM

## 2016-07-08 DIAGNOSIS — I130 Hypertensive heart and chronic kidney disease with heart failure and stage 1 through stage 4 chronic kidney disease, or unspecified chronic kidney disease: Secondary | ICD-10-CM | POA: Diagnosis present

## 2016-07-08 DIAGNOSIS — R197 Diarrhea, unspecified: Secondary | ICD-10-CM | POA: Diagnosis present

## 2016-07-08 DIAGNOSIS — G40909 Epilepsy, unspecified, not intractable, without status epilepticus: Secondary | ICD-10-CM | POA: Diagnosis present

## 2016-07-08 DIAGNOSIS — F0280 Dementia in other diseases classified elsewhere without behavioral disturbance: Secondary | ICD-10-CM | POA: Diagnosis present

## 2016-07-08 DIAGNOSIS — N179 Acute kidney failure, unspecified: Secondary | ICD-10-CM | POA: Diagnosis present

## 2016-07-08 DIAGNOSIS — M1990 Unspecified osteoarthritis, unspecified site: Secondary | ICD-10-CM | POA: Diagnosis present

## 2016-07-08 DIAGNOSIS — N289 Disorder of kidney and ureter, unspecified: Secondary | ICD-10-CM

## 2016-07-08 DIAGNOSIS — Z8673 Personal history of transient ischemic attack (TIA), and cerebral infarction without residual deficits: Secondary | ICD-10-CM

## 2016-07-08 DIAGNOSIS — R0603 Acute respiratory distress: Secondary | ICD-10-CM | POA: Diagnosis present

## 2016-07-08 DIAGNOSIS — I5022 Chronic systolic (congestive) heart failure: Secondary | ICD-10-CM | POA: Diagnosis present

## 2016-07-08 DIAGNOSIS — I4891 Unspecified atrial fibrillation: Secondary | ICD-10-CM | POA: Diagnosis present

## 2016-07-08 DIAGNOSIS — I10 Essential (primary) hypertension: Secondary | ICD-10-CM | POA: Diagnosis not present

## 2016-07-08 DIAGNOSIS — Z7982 Long term (current) use of aspirin: Secondary | ICD-10-CM | POA: Diagnosis not present

## 2016-07-08 DIAGNOSIS — G309 Alzheimer's disease, unspecified: Secondary | ICD-10-CM | POA: Diagnosis present

## 2016-07-08 DIAGNOSIS — N184 Chronic kidney disease, stage 4 (severe): Secondary | ICD-10-CM | POA: Diagnosis present

## 2016-07-08 LAB — COMPREHENSIVE METABOLIC PANEL
ALBUMIN: 2.8 g/dL — AB (ref 3.5–5.0)
ALT: 10 U/L — ABNORMAL LOW (ref 14–54)
ANION GAP: 9 (ref 5–15)
AST: 12 U/L — AB (ref 15–41)
Alkaline Phosphatase: 88 U/L (ref 38–126)
BUN: 32 mg/dL — AB (ref 6–20)
CHLORIDE: 112 mmol/L — AB (ref 101–111)
CO2: 22 mmol/L (ref 22–32)
Calcium: 9.4 mg/dL (ref 8.9–10.3)
Creatinine, Ser: 2.13 mg/dL — ABNORMAL HIGH (ref 0.44–1.00)
GFR calc Af Amer: 23 mL/min — ABNORMAL LOW (ref >60)
GFR calc non Af Amer: 20 mL/min — ABNORMAL LOW (ref >60)
GLUCOSE: 131 mg/dL — AB (ref 65–99)
Potassium: 4.2 mmol/L (ref 3.5–5.1)
Sodium: 143 mmol/L (ref 135–145)
Total Bilirubin: 0.7 mg/dL (ref 0.3–1.2)
Total Protein: 6.7 g/dL (ref 6.5–8.1)

## 2016-07-08 LAB — CBC WITH DIFFERENTIAL/PLATELET
BASOS ABS: 0 10*3/uL (ref 0.0–0.1)
Basophils Relative: 0 %
Eosinophils Absolute: 0.1 10*3/uL (ref 0.0–0.7)
Eosinophils Relative: 0 %
HEMATOCRIT: 43.1 % (ref 36.0–46.0)
Hemoglobin: 14.4 g/dL (ref 12.0–15.0)
LYMPHS ABS: 1.8 10*3/uL (ref 0.7–4.0)
Lymphocytes Relative: 13 %
MCH: 29.9 pg (ref 26.0–34.0)
MCHC: 33.4 g/dL (ref 30.0–36.0)
MCV: 89.6 fL (ref 78.0–100.0)
MONO ABS: 1.2 10*3/uL — AB (ref 0.1–1.0)
Monocytes Relative: 8 %
NEUTROS ABS: 11 10*3/uL — AB (ref 1.7–7.7)
Neutrophils Relative %: 79 %
Platelets: 150 10*3/uL (ref 150–400)
RBC: 4.81 MIL/uL (ref 3.87–5.11)
RDW: 13.8 % (ref 11.5–15.5)
WBC: 14 10*3/uL — ABNORMAL HIGH (ref 4.0–10.5)

## 2016-07-08 LAB — URINALYSIS, ROUTINE W REFLEX MICROSCOPIC
BACTERIA UA: NONE SEEN
BILIRUBIN URINE: NEGATIVE
Glucose, UA: NEGATIVE mg/dL
Ketones, ur: NEGATIVE mg/dL
LEUKOCYTES UA: NEGATIVE
Nitrite: NEGATIVE
Protein, ur: 100 mg/dL — AB
SPECIFIC GRAVITY, URINE: 1.019 (ref 1.005–1.030)
pH: 5 (ref 5.0–8.0)

## 2016-07-08 LAB — LIPASE, BLOOD: LIPASE: 11 U/L (ref 11–51)

## 2016-07-08 MED ORDER — HEPARIN SODIUM (PORCINE) 5000 UNIT/ML IJ SOLN
5000.0000 [IU] | Freq: Three times a day (TID) | INTRAMUSCULAR | Status: DC
  Administered 2016-07-09 – 2016-07-10 (×4): 5000 [IU] via SUBCUTANEOUS
  Filled 2016-07-08 (×5): qty 1

## 2016-07-08 MED ORDER — LEVETIRACETAM 500 MG PO TABS
1000.0000 mg | ORAL_TABLET | Freq: Two times a day (BID) | ORAL | Status: DC
  Administered 2016-07-09 – 2016-07-10 (×4): 1000 mg via ORAL
  Filled 2016-07-08 (×5): qty 2

## 2016-07-08 MED ORDER — TIZANIDINE HCL 2 MG PO TABS
4.0000 mg | ORAL_TABLET | Freq: Three times a day (TID) | ORAL | Status: DC | PRN
  Administered 2016-07-10 (×2): 4 mg via ORAL
  Filled 2016-07-08 (×3): qty 2

## 2016-07-08 MED ORDER — DEXTROSE 5 % IV SOLN
500.0000 mg | Freq: Every day | INTRAVENOUS | Status: DC
  Administered 2016-07-09: 500 mg via INTRAVENOUS
  Filled 2016-07-08: qty 500

## 2016-07-08 MED ORDER — DEXTROSE 5 % IV SOLN
1.0000 g | Freq: Every day | INTRAVENOUS | Status: DC
  Administered 2016-07-09 – 2016-07-10 (×2): 1 g via INTRAVENOUS
  Filled 2016-07-08 (×2): qty 10

## 2016-07-08 MED ORDER — METOPROLOL SUCCINATE ER 25 MG PO TB24
25.0000 mg | ORAL_TABLET | Freq: Every day | ORAL | Status: DC
  Administered 2016-07-09 – 2016-07-10 (×2): 25 mg via ORAL
  Filled 2016-07-08 (×2): qty 1

## 2016-07-08 MED ORDER — MORPHINE SULFATE (PF) 4 MG/ML IV SOLN
2.0000 mg | Freq: Once | INTRAVENOUS | Status: AC
  Administered 2016-07-08: 2 mg via INTRAVENOUS
  Filled 2016-07-08: qty 1

## 2016-07-08 MED ORDER — SODIUM CHLORIDE 0.9 % IV BOLUS (SEPSIS)
500.0000 mL | Freq: Once | INTRAVENOUS | Status: AC
  Administered 2016-07-08: 500 mL via INTRAVENOUS

## 2016-07-08 MED ORDER — SODIUM CHLORIDE 0.9 % IV SOLN
INTRAVENOUS | Status: DC
  Administered 2016-07-09: 03:00:00 via INTRAVENOUS

## 2016-07-08 MED ORDER — INSULIN ASPART 100 UNIT/ML ~~LOC~~ SOLN: 0.0000 [IU] | Freq: Every day | SUBCUTANEOUS | Status: DC

## 2016-07-08 MED ORDER — FEBUXOSTAT 40 MG PO TABS
40.0000 mg | ORAL_TABLET | Freq: Every day | ORAL | Status: DC
  Administered 2016-07-09 – 2016-07-10 (×2): 40 mg via ORAL
  Filled 2016-07-08 (×2): qty 1

## 2016-07-08 MED ORDER — INSULIN ASPART 100 UNIT/ML ~~LOC~~ SOLN
0.0000 [IU] | Freq: Three times a day (TID) | SUBCUTANEOUS | Status: DC
  Administered 2016-07-09: 1 [IU] via SUBCUTANEOUS
  Administered 2016-07-09 – 2016-07-10 (×2): 2 [IU] via SUBCUTANEOUS
  Administered 2016-07-10: 1 [IU] via SUBCUTANEOUS

## 2016-07-08 MED ORDER — OXYCODONE HCL 5 MG PO TABS
10.0000 mg | ORAL_TABLET | Freq: Three times a day (TID) | ORAL | Status: DC | PRN
  Administered 2016-07-09 – 2016-07-10 (×4): 10 mg via ORAL
  Filled 2016-07-08 (×4): qty 2

## 2016-07-08 MED ORDER — ONDANSETRON HCL 4 MG/2ML IJ SOLN: 4.0000 mg | Freq: Four times a day (QID) | INTRAMUSCULAR | Status: DC | PRN

## 2016-07-08 MED ORDER — DEXTROSE 5 % IV SOLN
2.0000 g | Freq: Once | INTRAVENOUS | Status: AC
  Administered 2016-07-08: 2 g via INTRAVENOUS
  Filled 2016-07-08: qty 2

## 2016-07-08 MED ORDER — SODIUM CHLORIDE 0.9 % IV SOLN
INTRAVENOUS | Status: DC
  Administered 2016-07-08: 16:00:00 via INTRAVENOUS

## 2016-07-08 MED ORDER — INSULIN GLARGINE 100 UNIT/ML ~~LOC~~ SOLN: 5.0000 [IU] | Freq: Every day | SUBCUTANEOUS | Status: DC

## 2016-07-08 MED ORDER — VANCOMYCIN HCL IN DEXTROSE 1-5 GM/200ML-% IV SOLN
1000.0000 mg | Freq: Once | INTRAVENOUS | Status: AC
  Administered 2016-07-08: 1000 mg via INTRAVENOUS
  Filled 2016-07-08: qty 200

## 2016-07-08 MED ORDER — IOPAMIDOL (ISOVUE-300) INJECTION 61%
INTRAVENOUS | Status: AC
  Administered 2016-07-08: 30 mL
  Filled 2016-07-08: qty 50

## 2016-07-08 MED ORDER — ROSUVASTATIN CALCIUM 10 MG PO TABS
10.0000 mg | ORAL_TABLET | Freq: Every evening | ORAL | Status: DC
  Administered 2016-07-09 – 2016-07-10 (×2): 10 mg via ORAL
  Filled 2016-07-08 (×2): qty 1

## 2016-07-08 MED ORDER — CALCITRIOL 0.25 MCG PO CAPS
0.2500 ug | ORAL_CAPSULE | ORAL | Status: DC
  Administered 2016-07-09: 0.25 ug via ORAL
  Filled 2016-07-08 (×2): qty 1

## 2016-07-08 MED ORDER — ASPIRIN 81 MG PO CHEW
81.0000 mg | CHEWABLE_TABLET | Freq: Every day | ORAL | Status: DC
  Administered 2016-07-09 – 2016-07-10 (×2): 81 mg via ORAL
  Filled 2016-07-08 (×2): qty 1

## 2016-07-08 MED ORDER — ONDANSETRON HCL 4 MG/2ML IJ SOLN
4.0000 mg | Freq: Once | INTRAMUSCULAR | Status: AC
  Administered 2016-07-08: 4 mg via INTRAVENOUS
  Filled 2016-07-08: qty 2

## 2016-07-09 DIAGNOSIS — R109 Unspecified abdominal pain: Secondary | ICD-10-CM | POA: Insufficient documentation

## 2016-07-09 DIAGNOSIS — N184 Chronic kidney disease, stage 4 (severe): Secondary | ICD-10-CM

## 2016-07-09 DIAGNOSIS — G309 Alzheimer's disease, unspecified: Secondary | ICD-10-CM

## 2016-07-09 DIAGNOSIS — N179 Acute kidney failure, unspecified: Secondary | ICD-10-CM

## 2016-07-09 DIAGNOSIS — J189 Pneumonia, unspecified organism: Principal | ICD-10-CM

## 2016-07-09 DIAGNOSIS — I482 Chronic atrial fibrillation: Secondary | ICD-10-CM

## 2016-07-09 DIAGNOSIS — E119 Type 2 diabetes mellitus without complications: Secondary | ICD-10-CM

## 2016-07-09 DIAGNOSIS — F0280 Dementia in other diseases classified elsewhere without behavioral disturbance: Secondary | ICD-10-CM

## 2016-07-09 LAB — STREP PNEUMONIAE URINARY ANTIGEN: STREP PNEUMO URINARY ANTIGEN: NEGATIVE

## 2016-07-09 LAB — BASIC METABOLIC PANEL
Anion gap: 10 (ref 5–15)
BUN: 30 mg/dL — AB (ref 6–20)
CHLORIDE: 113 mmol/L — AB (ref 101–111)
CO2: 18 mmol/L — ABNORMAL LOW (ref 22–32)
CREATININE: 1.88 mg/dL — AB (ref 0.44–1.00)
Calcium: 8.5 mg/dL — ABNORMAL LOW (ref 8.9–10.3)
GFR calc Af Amer: 27 mL/min — ABNORMAL LOW (ref >60)
GFR calc non Af Amer: 23 mL/min — ABNORMAL LOW (ref >60)
Glucose, Bld: 124 mg/dL — ABNORMAL HIGH (ref 65–99)
Potassium: 4 mmol/L (ref 3.5–5.1)
SODIUM: 141 mmol/L (ref 135–145)

## 2016-07-09 LAB — GLUCOSE, CAPILLARY
GLUCOSE-CAPILLARY: 107 mg/dL — AB (ref 65–99)
GLUCOSE-CAPILLARY: 149 mg/dL — AB (ref 65–99)
Glucose-Capillary: 140 mg/dL — ABNORMAL HIGH (ref 65–99)
Glucose-Capillary: 174 mg/dL — ABNORMAL HIGH (ref 65–99)

## 2016-07-09 LAB — CBC WITH DIFFERENTIAL/PLATELET
BASOS ABS: 0 10*3/uL (ref 0.0–0.1)
Basophils Relative: 0 %
EOS ABS: 0.1 10*3/uL (ref 0.0–0.7)
Eosinophils Relative: 1 %
HEMATOCRIT: 41 % (ref 36.0–46.0)
HEMOGLOBIN: 13.8 g/dL (ref 12.0–15.0)
LYMPHS ABS: 1.4 10*3/uL (ref 0.7–4.0)
LYMPHS PCT: 15 %
MCH: 30.2 pg (ref 26.0–34.0)
MCHC: 33.7 g/dL (ref 30.0–36.0)
MCV: 89.7 fL (ref 78.0–100.0)
MONOS PCT: 8 %
Monocytes Absolute: 0.7 10*3/uL (ref 0.1–1.0)
Neutro Abs: 7.1 10*3/uL (ref 1.7–7.7)
Neutrophils Relative %: 76 %
Platelets: 127 10*3/uL — ABNORMAL LOW (ref 150–400)
RBC: 4.57 MIL/uL (ref 3.87–5.11)
RDW: 13.8 % (ref 11.5–15.5)
WBC: 9.3 10*3/uL (ref 4.0–10.5)

## 2016-07-09 LAB — HIV ANTIBODY (ROUTINE TESTING W REFLEX): HIV SCREEN 4TH GENERATION: NONREACTIVE

## 2016-07-09 MED ORDER — HYDRALAZINE HCL 20 MG/ML IJ SOLN: 5.0000 mg | INTRAMUSCULAR | Status: DC | PRN

## 2016-07-09 MED ORDER — DEXTROSE-NACL 5-0.45 % IV SOLN
INTRAVENOUS | Status: AC
  Administered 2016-07-09: 14:00:00 via INTRAVENOUS
  Administered 2016-07-10: 1 mL via INTRAVENOUS

## 2016-07-09 MED ORDER — RESOURCE THICKENUP CLEAR PO POWD
ORAL | Status: DC | PRN
Start: 2016-07-09 — End: 2016-07-10
  Filled 2016-07-09: qty 125

## 2016-07-09 NOTE — Progress Notes (Signed)
PROGRESS NOTE  [place]  GMW:[id number] DOB: Apr 19, 1930 DOA: August 04, 2016 PCP: Billee Cashing, MD   Brief Narrative: Dawn Gaines is an 83 y.o. female with a history of alzheimer's dementia, HTN, AFib, chronic systolic CHF, and IDDM who was brought from home by her son for confusion and 2 days of diarrhea, abdominal pain. He also endorsed increased cough. On arrival, she was afebrile, saturating adequately on room air, tachypneic, and with vitals otherwise stable. SCr was 2.13, up from an apparent baseline of 1.7. WBC 14k and UA unremarkable. CT of the abdomen and pelvis was obtained and negative for any acute intra-abdominal or intrapelvic abnormality, but notable for right greater than left lower lobe opacities concerning for pneumonia. Patient was given 500 mL in normal saline, 4 mg IV morphine, zofran, and was started on empiric cefepime and vancomycin. She was admitted for respiratory distress, tachypnea at rest, due to community-acuired pneumonia with AKI due to diarrhea and poor per oral intake. She has remained hemodynamically stable without hypoxemia. She did not eat breakfast  Assessment & Plan: Principal Problem:   Pneumonia Active Problems:   Seizures (HCC)   Hypertension   Diabetes mellitus without complication (HCC)   Alzheimer disease   Atrial fibrillation (HCC)   Diarrhea   AKI (acute kidney injury) (HCC)   CKD (chronic kidney disease), stage IV (HCC)  Community-acquired pneumonia: With leukocytosis and tachypnea on admission as well as infiltrates at lung bases on CT abd/pelvis. - She was given 500 cc NS in ED and started on empiric vancomycin and cefepime > transitioned to CTX/azithromycin due to no risk factors for MDR organisms.  - Will continue IV abx as she has not been able to tolerate po overnight. Will switch to po abx as able.  Acute kidney injury superimposed on CKD stage IV: SCr is 2.13 on admission, up from apparent baseline of ~1.7. Improving with  fluids.  - Will continue judicious IVF (given HFrEF) due to no per oral intake. - Holding spironolactone.    IDDM: A1c only 5.6% in October 2017  - Holding home lantus 7 units qHS  - CBG and low-dose SSI AC/HS   Chronic atrial fibrillation: Rate-controlled AFib on admission.   - CHADS-VASc is 35 (age x2, CVA x2, gender, CHF, DM): Continue home aspirin (not fully anticoagulated due to age, fall risk).  - Continue metoprolol  Seizure disorder: Chronic, stable.  - Continue keppra  Chronic pain Pt complains of some abdominal pain on admission; abd exam benign and CT neg - Continue her home regimen of prn oxycodone. If no further BMs, consider bowel regimen.   7. Hx of CVA   - No new deficit identified  - Continue Crestor and ASA   Chronic systolic CHF: Hypoveolemic on admission improved with IVF.  - Continue judicious hypotonic fluids given poor po. TTE (01/27/16) with EF 30-35%, mild LVH, diffuse HK, mild AR, mild TR  - Pt appears dry on admission in setting of acute infection and recent diarrhea  - Continue metoprolol succinate, holding spironolactone. - Following daily wts and I/O's; resume diuretic as indicated   Diarrhea: Pt reports 2 days of diarrhea; none since arrival. Abd exam benign and CT abd/pelvis was negative for acute intraabdominal or intrapelvic abnormality - Monitor, would consider stool studies if recurs  DVT prophylaxis: Heparin Code Status: Partial Family Communication: Son at bedside Disposition Plan: Continue IV antibiotics, hopeful for DC in 24 - 48 hours. Will get PT evaluation.   Consultants:   None  Procedures:  None  Antimicrobials:  Cefepime, vancomycin 4/1  Ceftriaxone, azithromycin 4/1 >>    Subjective: Pt confused at her baseline, son at bedside at bedside. No events overnight. Afebrile. Says she could not eat breakfast due to nausea. No further diarrhea.  Objective: Vitals:   07/09/16 0000 07/09/16 0125 07/09/16 0339 07/09/16  1205  BP: 128/68 (!) 151/86 138/78 (!) 159/75  Pulse: 64  68 75  Resp: Temp:  97.7 F (36.5 C) 97.9 F (36.6 C)   TempSrc:  Axillary Axillary   SpO2: 98% 97% 97%   Weight:  61.3 kg (135 lb 1.6 oz)      Intake/Output Summary (Last 24 hours) at 07/09/16 1248 Last data filed at 07/09/16 0600  Gross per 24 hour  Intake             2000 ml  Output              100 ml  Net             1900 ml   Filed Weights   07/09/16 0125  Weight: 61.3 kg (135 lb 1.6 oz)    Examination: General exam: Elderly female in no distress Respiratory system: Non-labored breathing on room air. Decreased at bases without crackles or wheezes Cardiovascular system: Irreg, rate ~70s. No murmur, rub, or gallop. No JVD, and no pedal edema. Gastrointestinal system: Abdomen soft, non-tender, non-distended, with normoactive bowel sounds. No organomegaly or masses felt. Central nervous system: Alert and disoriented. No focal neurological deficits. Extremities: Warm, no deformities Skin: No rashes, lesions no ulcers Psychiatry: Judgement and insight appear impaired due to cognitive impairment. Mood & affect appropriate.   Data Reviewed: I have personally reviewed following labs and imaging studies  CBC:  Recent Labs Lab 2016-07-09 1614 07/09/16 0350  WBC 14.0* 9.3  NEUTROABS 11.0* 7.1  HGB 14.4 13.8  HCT 43.1 41.0  MCV 89.6 89.7  PLT 150 127*   Basic Metabolic Panel:  Recent Labs Lab 07-09-2016 1614 07/09/16 0350  NA 143 141  K 4.2 4.0  CL 112* 113*  CO2 22 18*  GLUCOSE 131* 124*  BUN 32* 30*  CREATININE 2.13* 1.88*  CALCIUM 9.4 8.5*   GFR: Estimated Creatinine Clearance: 20 mL/min (A) (by C-G formula based on SCr of 1.88 mg/dL (H)). Liver Function Tests:  Recent Labs Lab 09-Jul-2016 1614  AST 12*  ALT 10*  ALKPHOS 88  BILITOT 0.7  PROT 6.7  ALBUMIN 2.8*    Recent Labs Lab July 09, 2016 1614  LIPASE 11   No results for input(s): AMMONIA in the last 168 hours. Coagulation  Profile: No results for input(s): INR, PROTIME in the last 168 hours. Cardiac Enzymes: No results for input(s): CKTOTAL, CKMB, CKMBINDEX, TROPONINI in the last 168 hours. BNP (last 3 results) No results for input(s): PROBNP in the last 8760 hours. HbA1C: No results for input(s): HGBA1C in the last 72 hours. CBG:  Recent Labs Lab 07/09/16 0756 07/09/16 1226  GLUCAP 107* 140*   Lipid Profile: No results for input(s): CHOL, HDL, LDLCALC, TRIG, CHOLHDL, LDLDIRECT in the last 72 hours. Thyroid Function Tests: No results for input(s): TSH, T4TOTAL, FREET4, T3FREE, THYROIDAB in the last 72 hours. Anemia Panel: No results for input(s): VITAMINB12, FOLATE, FERRITIN, TIBC, IRON, RETICCTPCT in the last 72 hours. Urine analysis:    Component Value Date/Time   COLORURINE YELLOW July 09, 2016 1446   APPEARANCEUR CLEAR 2016-07-09 1446   LABSPEC 1.019 07/09/16 1446   PHURINE 5.0 09-Jul-2016  1446   GLUCOSEU NEGATIVE 2016/07/17 1446   HGBUR SMALL (A) 07-17-16 1446   BILIRUBINUR NEGATIVE 07/17/2016 1446   KETONESUR NEGATIVE 07-17-2016 1446   PROTEINUR 100 (A) 07-17-16 1446   NITRITE NEGATIVE 07/17/16 1446   LEUKOCYTESUR NEGATIVE 07-17-2016 1446   No results found for this or any previous visit (from the past 240 hour(s)).    Radiology Studies: Ct Abdomen Pelvis Wo Contrast  Result Date: 2016-07-17 CLINICAL DATA:  Diffuse abdominal pain. Diarrhea for 2 days. Altered mental status. Elevated creatinine. EXAM: CT ABDOMEN AND PELVIS WITHOUT CONTRAST TECHNIQUE: Multidetector CT imaging of the abdomen and pelvis was performed following the standard protocol without IV contrast. COMPARISON:  None. FINDINGS: Lower chest: There is consolidation posteriorly in the right lower lobe, and there is milder, patchy opacity in the left lower lobe. No pleural effusion. ICD lead in the right ventricle. Coronary artery atherosclerosis. Hepatobiliary: Punctate calcification in the left hepatic lobe. Prior  cholecystectomy. No significant biliary dilatation. Pancreas: Fatty infiltration without evidence of ductal dilatation or inflammation. Spleen: Unremarkable. Adrenals/Urinary Tract: Unremarkable right adrenal gland. Mild left adrenal gland thickening versus motion artifact. Atrophic right kidney. No evidence of renal calculi or hydronephrosis. Unremarkable bladder. Stomach/Bowel: The stomach is within normal limits. A small amount oral contrast is present in the stomach and proximal small bowel. There is no evidence of bowel obstruction or inflammation. May small to moderate amount of stool is present in the colon. The appendix is unremarkable. Vascular/Lymphatic: Advanced atherosclerotic calcification of the abdominal aorta and its major branch vessels. No enlarged lymph nodes. Reproductive: Prior hysterectomy.  Unremarkable ovaries. Other: No intraperitoneal free fluid. Postsurgical changes along the anterior abdominal wall with suspected prior ventral hernia repair. No current hernia. Musculoskeletal: Advanced lumbar disc and facet degeneration. IMPRESSION: 1. Right greater than left lower lobe pulmonary opacities concerning for pneumonia. 2. No acute abnormality identified in the abdomen or pelvis. 3. Advanced aortic atherosclerosis. 4. Atrophic right kidney. Electronically Signed   By: Sebastian Ache M.D.   On: 17-Jul-2016 21:22   Dg Chest 2 View  Result Date: 2016/07/17 CLINICAL DATA:  Chest pain EXAM: CHEST  2 VIEW COMPARISON:  01/25/2016 chest radiograph. FINDINGS: Stable configuration of single lead left subclavian ICD. Stable cardiomediastinal silhouette with mild cardiomegaly and aortic atherosclerosis. No pneumothorax. No pleural effusion. No pulmonary edema. Mild scarring versus atelectasis at the left lung base. No consolidative airspace disease. Surgical clips are seen in the right upper quadrant of the abdomen. IMPRESSION: Mild scarring versus atelectasis at the left lung base. Otherwise no active  cardiopulmonary disease. Stable mild cardiomegaly without pulmonary edema. Aortic atherosclerosis. Electronically Signed   By: Delbert Phenix M.D.   On: 07/17/2016 15:31    Scheduled Meds: . aspirin  81 mg Oral Daily  . azithromycin  500 mg Intravenous QHS  . calcitRIOL  0.25 mcg Oral Q M,W,F  . cefTRIAXone (ROCEPHIN)  IV  1 g Intravenous Daily  . febuxostat  40 mg Oral Daily  . heparin  5,000 Units Subcutaneous Q8H  . insulin aspart  0-5 Units Subcutaneous QHS  . insulin aspart  0-9 Units Subcutaneous TID WC  . levETIRAcetam  1,000 mg Oral BID  . metoprolol succinate  25 mg Oral Daily  . rosuvastatin  10 mg Oral QPM   Continuous Infusions:   LOS: 1 day   Time spent: 25 minutes.  Hazeline Junker, MD [facility] Pager [phone]  If 7PM-7AM, please contact night-coverage [email or web address] Password [facility] 07/09/2016, 12:48 PM

## 2016-07-10 DIAGNOSIS — R569 Unspecified convulsions: Secondary | ICD-10-CM

## 2016-07-10 LAB — HEMOGLOBIN A1C
HEMOGLOBIN A1C: 6.3 % — AB (ref 4.8–5.6)
MEAN PLASMA GLUCOSE: 134 mg/dL

## 2016-07-10 LAB — BASIC METABOLIC PANEL
Anion gap: 12 (ref 5–15)
BUN: 21 mg/dL — ABNORMAL HIGH (ref 6–20)
CALCIUM: 8.4 mg/dL — AB (ref 8.9–10.3)
CO2: 19 mmol/L — ABNORMAL LOW (ref 22–32)
Chloride: 108 mmol/L (ref 101–111)
Creatinine, Ser: 1.9 mg/dL — ABNORMAL HIGH (ref 0.44–1.00)
GFR calc Af Amer: 27 mL/min — ABNORMAL LOW (ref >60)
GFR, EST NON AFRICAN AMERICAN: 23 mL/min — AB (ref >60)
GLUCOSE: 146 mg/dL — AB (ref 65–99)
Potassium: 3.8 mmol/L (ref 3.5–5.1)
SODIUM: 139 mmol/L (ref 135–145)

## 2016-07-10 LAB — CBC
HCT: 39.5 % (ref 36.0–46.0)
Hemoglobin: 13.1 g/dL (ref 12.0–15.0)
MCH: 29.9 pg (ref 26.0–34.0)
MCHC: 33.2 g/dL (ref 30.0–36.0)
MCV: 90.2 fL (ref 78.0–100.0)
Platelets: 152 10*3/uL (ref 150–400)
RBC: 4.38 MIL/uL (ref 3.87–5.11)
RDW: 13.6 % (ref 11.5–15.5)
WBC: 8.8 10*3/uL (ref 4.0–10.5)

## 2016-07-10 LAB — GLUCOSE, CAPILLARY
GLUCOSE-CAPILLARY: 158 mg/dL — AB (ref 65–99)
GLUCOSE-CAPILLARY: 103 mg/dL — AB (ref 65–99)
Glucose-Capillary: 146 mg/dL — ABNORMAL HIGH (ref 65–99)

## 2016-07-10 MED ORDER — LEVOFLOXACIN 250 MG PO TABS: 250.0000 mg | ORAL_TABLET | Freq: Every day | ORAL | 0 refills | Status: AC

## 2016-07-10 MED ORDER — LEVOFLOXACIN 250 MG PO TABS: 750.0000 mg | ORAL_TABLET | Freq: Every day | ORAL | 0 refills | Status: DC

## 2016-07-10 MED ORDER — AZITHROMYCIN 250 MG PO TABS
500.0000 mg | ORAL_TABLET | Freq: Every day | ORAL | Status: DC
  Administered 2016-07-10: 500 mg via ORAL
  Filled 2016-07-10: qty 2

## 2016-07-10 NOTE — Discharge Summary (Signed)
Physician Discharge Summary  [name] WUJ:[id number] DOB: 21-Mar-1931 DOA: 07-26-16  PCP: Billee Cashing, MD  Admit date: July 26, 2016 Discharge date: 07/10/2016  Admitted From: Home Disposition: Home   Recommendations for Outpatient Follow-up:  1. Follow up with PCP in 1-2 weeks 2. Please obtain BMP/CBC in one week 3. Please follow up on the following pending results:  Home Health: PT, OT, aide Equipment/Devices: None recommended Discharge Condition: Stable CODE STATUS: Partial (no compressions) Diet recommendation: Carb-modified, heart healthy  Brief/Interim Summary: [place] an 83 y.o.femalewith a history of alzheimer's dementia, HTN, AFib, chronic systolic CHF, and IDDM who was brought from home by her son for confusion and 2 days of diarrhea, abdominal pain. He also endorsed increased cough. On arrival, she was afebrile, saturating adequately on room air, tachypneic, and with vitals otherwise stable. SCr was 2.13, up from an apparent baseline of 1.7. WBC 14k and UA unremarkable. CT of the abdomen and pelvis was obtained and negative for any acute intra-abdominal or intrapelvic abnormality, but notable for right greater than left lower lobe opacities concerning for pneumonia. Patient was given 500 mL in normal saline, 4 mg IV morphine, zofran, and was started on empiric cefepime and vancomycin. She was admitted for respiratory distress due to community-acuired pneumonia with AKI. She has remained hemodynamically stable without hypoxemia, and tachypnea has resolved. WBC has normalized and she has not had any nausea/vomiting or diarrhea. Physical therapy evaluated the patient prior to discharge, recommending SNF. After discussions with her son and daughter-in-law, with whom she has been living for several months, it is felt that she will be best served in her home environment with home health. Her Daughter-in-law provides 24 hours supervision and daily ambulation assistance. Care  management has been consulted and will arrange the above home health. She will follow up with her PCP after discharge and complete 5 more days of renally-dosed levaquin.   Discharge Diagnoses:  Principal Problem:   Pneumonia Active Problems:   Seizures (HCC)   Hypertension   Diabetes mellitus without complication (HCC)   Alzheimer disease   Atrial fibrillation (HCC)   Diarrhea   AKI (acute kidney injury) (HCC)   CKD (chronic kidney disease), stage IV (HCC)  Community-acquired pneumonia: With leukocytosis and tachypnea on admission as well as infiltrates at lung bases on CT abd/pelvis. Respiratory distress has resolved. Leukocytosis resolved.  - She was given 500 cc NS in ED and started on empiric vancomycin and cefepime > transitioned to CTX/azithromycin due to no risk factors for MDR organisms. Will discharge on levaquin  po daily starting 4/4, as she is tolerating per oral intake with feeding.   Acute kidney injury superimposed on CKD stage IV: SCr was 2.13 on admission, up from apparent baseline of ~1.7. Improved with fluids and treatment of CAP.  - Restart home medications at discharge.  - Levaquin dosed for CrCl of 23ml/min  IDDM: A1c only 5.6% in October 2017  - Held home lantus 7 units qHS  - CBG and low-dose SSI AC/HS > remained at inpatient goal.    Chronic atrial fibrillation: Rate-controlled AFib on admission.   - CHADS-VASc is 61 (age x2, CVA x2, gender, CHF, DM): Continue home aspirin (not fully anticoagulated due to age, fall risk).  - Continue metoprolol  Seizure disorder: Chronic, stable.  - Continue keppra  Chronic pain Pt complained of some abdominal pain on admission; abd exam benign and CT neg.  - Continue her home regimen of prn oxycodone. - If no further BMs, consider bowel  regimen.   Hx of CVA  - No new deficit identified  - Continued crestor and ASA   Chronic systolic CHF: TTE (01/27/16) with EF 30-35%, mild LVH, diffuse HK, mild AR, mild  TR. Euvolemic at discharge.   - Continue metoprolol succinate, held spironolactone while inpatient.  Diarrhea: Pt reports 2 days of diarrhea; none since arrival. Had normal stool 4/2. Abd exam benign and CT abd/pelvis was negative for acute intraabdominal or intrapelvic abnormality - Monitor, would consider stool studies if recurs  Discharge Instructions Discharge Instructions    Discharge instructions    Complete by:  As directed    You were admitted for complications of pneumonia. Fortunately you have improved on antibiotics and may be discharged with the following recommendations:  - Continue taking antibiotics: Take levaquin daily for the next 5 days, starting tomorrow.  - Our care managers are arranging home health physical and occupational therapy as well as a home health aid.  - Call your primary care provider for hospital follow up in the next 1 - 2 weeks. - If symptoms return, please seek medical care.     Allergies as of 07/10/2016   No Known Allergies     Medication List    TAKE these medications   aspirin 81 MG chewable tablet Chew 1 tablet (81 mg total) by mouth daily.   calcitRIOL 0.25 MCG capsule Commonly known as:  ROCALTROL Take 0.25 mcg by mouth 3 (three) times a week. AS DIRECTED   febuxostat 40 MG tablet Commonly known as:  ULORIC Take 40 mg by mouth daily.   Insulin Glargine 100 UNIT/ML Solostar Pen Commonly known as:  LANTUS SOLOSTAR Inject 7 Units into the skin at bedtime. What changed:  when to take this  reasons to take this   levETIRAcetam 1000 MG tablet Commonly known as:  KEPPRA Take 1,000 mg by mouth 2 (two) times daily.   levofloxacin 250 MG tablet Commonly known as:  LEVAQUIN Take 1 tablet (250 mg total) by mouth daily. Start taking on:  07/11/2016   metoprolol succinate 25 MG 24 hr tablet Commonly known as:  TOPROL-XL Take 1 tablet (25 mg total) by mouth daily.   Oxycodone HCl 10 MG Tabs Take 10 mg by mouth 3 (three) times daily  as needed for pain.   rosuvastatin 10 MG tablet Commonly known as:  CRESTOR Take 10 mg by mouth every evening.   spironolactone 25 MG tablet Commonly known as:  ALDACTONE Take 25 mg by mouth every morning.   tiZANidine 4 MG tablet Commonly known as:  ZANAFLEX Take 4 mg by mouth 3 (three) times daily as needed for spasms.      Follow-up Information    Billee Cashing, MD Follow up.   Specialty:  Family Medicine Contact information: [address] [place] [address] [phone]          No Known Allergies  Consultations:  None  Procedures/Studies: Ct Abdomen Pelvis Wo Contrast  Result Date: July 23, 2016 CLINICAL DATA:  Diffuse abdominal pain. Diarrhea for 2 days. Altered mental status. Elevated creatinine. EXAM: CT ABDOMEN AND PELVIS WITHOUT CONTRAST TECHNIQUE: Multidetector CT imaging of the abdomen and pelvis was performed following the standard protocol without IV contrast. COMPARISON:  None. FINDINGS: Lower chest: There is consolidation posteriorly in the right lower lobe, and there is milder, patchy opacity in the left lower lobe. No pleural effusion. ICD lead in the right ventricle. Coronary artery atherosclerosis. Hepatobiliary: Punctate calcification in the left hepatic lobe. Prior  cholecystectomy. No significant biliary dilatation. Pancreas: Fatty infiltration without evidence of ductal dilatation or inflammation. Spleen: Unremarkable. Adrenals/Urinary Tract: Unremarkable right adrenal gland. Mild left adrenal gland thickening versus motion artifact. Atrophic right kidney. No evidence of renal calculi or hydronephrosis. Unremarkable bladder. Stomach/Bowel: The stomach is within normal limits. A small amount oral contrast is present in the stomach and proximal small bowel. There is no evidence of bowel obstruction or inflammation. May small to moderate amount of stool is present in the colon. The appendix is unremarkable. Vascular/Lymphatic: Advanced  atherosclerotic calcification of the abdominal aorta and its major branch vessels. No enlarged lymph nodes. Reproductive: Prior hysterectomy.  Unremarkable ovaries. Other: No intraperitoneal free fluid. Postsurgical changes along the anterior abdominal wall with suspected prior ventral hernia repair. No current hernia. Musculoskeletal: Advanced lumbar disc and facet degeneration. IMPRESSION: 1. Right greater than left lower lobe pulmonary opacities concerning for pneumonia. 2. No acute abnormality identified in the abdomen or pelvis. 3. Advanced aortic atherosclerosis. 4. Atrophic right kidney. Electronically Signed   By: Sebastian Ache M.D.   On: 31-Jul-2016 21:22   Dg Chest 2 View  Result Date: July 31, 2016 CLINICAL DATA:  Chest pain EXAM: CHEST  2 VIEW COMPARISON:  01/25/2016 chest radiograph. FINDINGS: Stable configuration of single lead left subclavian ICD. Stable cardiomediastinal silhouette with mild cardiomegaly and aortic atherosclerosis. No pneumothorax. No pleural effusion. No pulmonary edema. Mild scarring versus atelectasis at the left lung base. No consolidative airspace disease. Surgical clips are seen in the right upper quadrant of the abdomen. IMPRESSION: Mild scarring versus atelectasis at the left lung base. Otherwise no active cardiopulmonary disease. Stable mild cardiomegaly without pulmonary edema. Aortic atherosclerosis. Electronically Signed   By: Delbert Phenix M.D.   On: 2016-07-31 15:31   Subjective: No events overnight. Eating lunch when fed by her son this afternoon. No nausea, vomiting, diarrhea. Intermittently yelling out which her son tells me is her baseline. She does not appear to be in any pain.   Discharge Exam: BP (!) 153/76   Pulse 67   Temp 97.7 F  Resp 16   Wt 61.3 kg (135 lb 1.6 oz)   SpO2 97%   BMI 22.48 kg/m   General: Elderly female in no distress Cardiovascular: Irreg, rate ~70bpm. No murmur. No JVD. Respiratory: Nonlabored on room air, improving air movement  in bilateral bases without crackles, rhonchi, or wheezes. Abdominal: Soft, NT, ND, bowel sounds +  Labs: Basic Metabolic Panel:  Recent Labs Lab 31-Jul-2016 1614 07/09/16 0350 07/10/16 0438  NA 143 141 139  K 4.2 4.0 3.8  CL 112* 113* 108  CO2 22 18* 19*  GLUCOSE 131* 124* 146*  BUN 32* 30* 21*  CREATININE 2.13* 1.88* 1.90*  CALCIUM 9.4 8.5* 8.4*   Liver Function Tests:  Recent Labs Lab 07/31/2016 1614  AST 12*  ALT 10*  ALKPHOS 88  BILITOT 0.7  PROT 6.7  ALBUMIN 2.8*    Recent Labs Lab 31-Jul-2016 1614  LIPASE 11   CBC:  Recent Labs Lab 07-31-16 1614 07/09/16 0350 07/10/16 0438  WBC 14.0* 9.3 8.8  NEUTROABS 11.0* 7.1  --   HGB 14.4 13.8 13.1  HCT 43.1 41.0 39.5  MCV 89.6 89.7 90.2  PLT 150 127* 152   CBG:  Recent Labs Lab 07/09/16 1226 07/09/16 1736 07/09/16 2320 07/10/16 0744 07/10/16 1147  GLUCAP 140* 174* 149* 158* 146*   Hgb A1c  Recent Labs  2016/07/31 2258  HGBA1C 6.3*   Urinalysis    Component Value  Date/Time   COLORURINE YELLOW 20-Jul-2016 1446   APPEARANCEUR CLEAR 20-Jul-2016 1446   LABSPEC 1.019 20-Jul-2016 1446   PHURINE 5.0 2016-07-20 1446   GLUCOSEU NEGATIVE Jul 20, 2016 1446   HGBUR SMALL (A) Jul 20, 2016 1446   BILIRUBINUR NEGATIVE Jul 20, 2016 1446   KETONESUR NEGATIVE 20-Jul-2016 1446   PROTEINUR 100 (A) 20-Jul-2016 1446   NITRITE NEGATIVE 2016-07-20 1446   LEUKOCYTESUR NEGATIVE 07/20/2016 1446   Time coordinating discharge: Approximately 40 minutes  Hazeline Junker, MD  [facility] 07/10/2016, 4:37 PM Pager [phone]

## 2016-07-10 NOTE — Progress Notes (Signed)
Progress Note for the Evaluation of Need for a Hospital Bed   Patient Name: __Virginia Wooden___________________________________        DOB: _1932/12/27_______ Diagnosis Codes: __ICM 10 CM 150.22_________________________________              Height: __5'5"________        Weight: ___136 lbs_______  CHF or Chronic Pulmonary Condition  Patient suffers from _____CHF_______________________ and has trouble breathing at night when head is elevated less than                                           (CHF or chronic pulmonary Dx)  ____30 degrees_______. Bed wedges do not provide enough elevation to resolve breathing issues. ___Shortness of Breath____  (30 or more)                        (Relevant symptoms)   cause patient to require frequent and immediate changes in body position which cannot be achieved with a normal bed.  Isidoro Donning RN CCM Case Mgmt phone [phone]

## 2016-07-10 NOTE — Progress Notes (Signed)
Spoke to case manager who is trying to arrange hospital bed for pt. Other than that, pt. Is good to go per case Production designer, theatre/television/film.

## 2016-07-10 NOTE — Evaluation (Signed)
Physical Therapy Evaluation Patient Details Name: Dawn Gaines MRN: [id number] DOB: October 31, 1930 Today's Date: 07/10/2016   History of Present Illness   Pt is a 83 yo female admitted 2016/07/23 with pneumonia. PMH significant for Alzheimer's dementia, hypertension, atrial fibrillation, chronic systolic CHF, and insulin-dependent diabetes mellitus who presents the emergency department with 2 days of diarrhea, abdominal pain, and increased confusion  Clinical Impression  Pt admitted with above diagnosis. Pt currently with functional limitations due to the deficits listed below (see PT Problem List). For safety pt is max Ax2 for bed mobility and transfers due to decreased cognition and impulsivity in movement. Pt will benefit from skilled PT to increase their independence and safety with mobility to allow discharge to the venue listed below.       Follow Up Recommendations SNF    Equipment Recommendations   (TBD)    Recommendations for Other Services OT consult     Precautions / Restrictions Precautions Precautions: Fall Restrictions Weight Bearing Restrictions: No      Mobility  Bed Mobility Overal bed mobility: Needs Assistance Bed Mobility: Supine to Sit     Supine to sit: Max assist;+2 for physical assistance;+2 for safety/equipment;HOB elevated     General bed mobility comments: Pt with varied assistance in coming to EoB requiring from minA to maxAx2 with maximal verbal and visual cuing. Pt very strong and can push in opposite direction in mid transfer   Transfers Overall transfer level: Needs assistance Equipment used: None Transfers: Stand Pivot Transfers   Stand pivot transfers: +2 physical assistance;Max assist;From elevated surface       General transfer comment: Pt transfer to from recliner to change bed. Pt mod A x2 then patient got scared transferring to chair and required max Ax2 to safely sit in chair. Transfer from recliner back to bed MaxAx2       Balance  Overall balance assessment: Needs assistance Sitting-balance support: Feet supported;Bilateral upper extremity supported Sitting balance-Leahy Scale: Poor Sitting balance - Comments: Able to sit for <5 sec with min guard then began pushing backward Postural control: Posterior lean Standing balance support: Bilateral upper extremity supported Standing balance-Leahy Scale: Zero Standing balance comment: max Assist x2 to maintain standing                             Pertinent Vitals/Pain Pain Assessment: Faces Faces Pain Scale: Hurts a little bit Pain Location: abdomen Pain Intervention(s): Limited activity within patient's tolerance;Monitored during session         VSS  Home Living Family/patient expects to be discharged to:: Unsure                 Additional Comments: Pt nonverbal and no family in room    Prior Function Level of Independence: Needs assistance   Gait / Transfers Assistance Needed: chart notes recent falls  ADL's / Homemaking Assistance Needed: chart noted previously dependent for ADL  Comments: Limited info in chart re: PLOF and pt unable to provide info         Extremity/Trunk Assessment   Upper Extremity Assessment Upper Extremity Assessment: Defer to OT evaluation    Lower Extremity Assessment Lower Extremity Assessment: Difficult to assess due to impaired cognition       Communication   Communication: Expressive difficulties  Cognition Arousal/Alertness: Awake/alert Behavior During Therapy: Restless;Anxious;Impulsive Overall Cognitive Status: Impaired/Different from baseline Area of Impairment: Orientation;Attention;Memory;Following commands;Safety/judgement;Awareness;Problem solving  Orientation Level: Disoriented to;Person;Place;Time;Situation Current Attention Level: Selective   Following Commands: Follows one step commands inconsistently Safety/Judgement: Decreased awareness of safety;Decreased  awareness of deficits Awareness: Emergent Problem Solving: Slow processing;Decreased initiation;Difficulty sequencing;Requires verbal cues;Requires tactile cues General Comments: Pt with overall cognition of goals of getting up to EoB and transferring into recliner but does not follow commands and does not have understanding of how to safely complete tasks       Assessment/Plan    PT Assessment Patient needs continued PT services  PT Problem List Decreased activity tolerance;Decreased balance;Decreased mobility;Decreased coordination;Decreased cognition;Decreased safety awareness;Pain       PT Treatment Interventions Therapeutic activities;Therapeutic exercise;Patient/family education    PT Goals (Current goals can be found in the Care Plan section)  Acute Rehab PT Goals PT Goal Formulation: Patient unable to participate in goal setting Time For Goal Achievement: 07/17/16 Potential to Achieve Goals: Fair    Frequency Min 3X/week    End of Session Equipment Utilized During Treatment: Gait belt Activity Tolerance: Treatment limited secondary to agitation Patient left: in bed;with bed alarm set;with call bell/phone within reach Nurse Communication: Mobility status PT Visit Diagnosis: Other abnormalities of gait and mobility (R26.89);Repeated falls (R29.6);Pain;Unsteadiness on feet (R26.81) Pain - Right/Left:  (abdomen)    Time: 1610-9604 PT Time Calculation (min) (ACUTE ONLY): 46 min   Charges:   PT Evaluation $PT Eval Moderate Complexity: 1 Procedure PT Treatments $Therapeutic Activity: 23-37 mins   PT G Codes:        Aleph Nickson B. Beverely Risen PT, DPT Acute Rehabilitation  [phone] Pager [phone]    Elon Alas Fleet 07/10/2016, 4:15 PM

## 2016-07-10 NOTE — Progress Notes (Signed)
NCM contacted [facility] DME rep, Jermaine for hospital bed. States he will follow up with pt's dtr-in-law, Sarah for delivery time. Isidoro Donning RN CCM Case Mgmt phone [phone]

## 2016-07-10 NOTE — Care Management Note (Signed)
Case Management Note  Patient Details  Name: Dawn Gaines MRN: [id number] Date of Birth: October 02, 1930  Subjective/Objective:   83 yr old female admitted with pneumonia, diarrhea.                 Action/Plan:  Case manager contacted patient's daughter-in-law, Cline Crock, [phone] to discuss Home Health and DME needs. Choice was offered for Home Health. Referral was called to Janeice [facility] Liaison. CM received a call back from Clydie Braun stating they can not accept patient because her primary doesn't do HH orders for them. CM contacted Maralyn Sago and explained that another agency had to be contacted. Referral called to Maureen Chatters, [facility] Liaison. CM spoke with Dr. Jarvis Newcomer concerning patient's need for hospital bed.Order has been entered.     Expected Discharge Date:  07/10/16               Expected Discharge Plan:   Home with Home Health  In-House Referral:     Discharge planning Services  CM Consult  Post Acute Care Choice:  Home Health, Durable Medical Equipment Choice offered to:  Adult Children (CM Spoke with Patient'sw daughter-in-law Cline Crock)  DME Arranged:  Hospital bed DME Agency:  [facility]  HH Arranged:  PT, OT [facility] Agency:  [facility]  Status of Service:  Completed, signed off  If discussed at Long Length of Stay Meetings, dates discussed:    Additional Comments:  Durenda Guthrie, RN 07/10/2016, 5:02 PM

## 2016-07-11 NOTE — Progress Notes (Signed)
Patient was discharged via wheelchair with daughter-in-law and grandson. All questions were answered, all medications were gone over and all discharge instructions were given. Patients daughter-in-law was easily able to "teach back" all instructions.  Cindee Salt, RN

## 2016-08-07 NOTE — ED Notes (Signed)
[phone] cell  Gery Pray - son

## 2016-08-07 NOTE — ED Triage Notes (Signed)
Patient comes from home . EMS reports patient has had diarrhea x2 days and alerted mental status. Patient has history of dementia. Patient able to tell name DOB, location. Patient moving all extremities. EMS reports Family states last time she was altered she had a UTI.

## 2016-08-07 NOTE — ED Provider Notes (Signed)
[facility]-EMERGENCY DEPT Provider Note   CSN: [id number] Arrival date & time: 08-05-2016  1346  By signing my name below, I, Freida Busman, attest that this documentation has been prepared under the direction and in the presence of Linwood Dibbles, MD . Electronically Signed: Freida Busman, Scribe. 08-05-16. 3:50 PM.   History   Chief Complaint Chief Complaint  Patient presents with  . Diarrhea   LEVEL 5 CAVEAT DUE TO AMS  The history is provided by medical records, the EMS personnel and the patient. No language interpreter was used.     HPI Comments:  Dawn Gaines is a 83 y.o. female with a history of alzheimer's, stroke, and DM, who presents to the Emergency Department via EMS complaining of abdominal pain. Pt reports diarrhea since this AM. She denies vomiting. Per triage note pt is here for diarrhea x 2 days and AMS. Pt's family stated to EMS that the last time pt was altered she was diagnosed with a UTI.    Past Medical History:  Diagnosis Date  . Alzheimer disease    per family  . Alzheimer's dementia   . Arthritis    "all over" (01/27/2016)  . Gout   . Hypercholesteremia   . Hypertension   . Pacemaker   . Seizures (HCC)    "takes RX for them qd; don't know what kind" (01/27/2016)  . Stroke [facility]) ~ 2015   "weakness on left side; speech problems; mouth a little twisted" (01/27/2016)  . Type II diabetes mellitus [facility])     Patient Active Problem List   Diagnosis Date Noted  . Fall 01/26/2016  . Atrial fibrillation (HCC) 01/26/2016  . UTI (urinary tract infection) 01/26/2016  . Stroke (HCC)   . Seizures (HCC)   . Pacemaker   . Hypertension   . Hypercholesteremia   . Diabetes mellitus without complication (HCC)   . Alzheimer disease   . Syncope and collapse   . Syncope 01/25/2016    Past Surgical History:  Procedure Laterality Date  . CATARACT EXTRACTION, BILATERAL Bilateral   . INSERT / REPLACE / REMOVE PACEMAKER  ?2014    OB History    No data available        Home Medications    Prior to Admission medications   Medication Sig Start Date End Date Taking? Authorizing Provider  aspirin 81 MG chewable tablet Chew 1 tablet (81 mg total) by mouth daily. 01/28/16  Yes Narda Bonds, MD  calcitRIOL (ROCALTROL) 0.25 MCG capsule Take 0.25 mcg by mouth 3 (three) times a week. AS DIRECTED   Yes Historical Provider, MD  febuxostat (ULORIC) 40 MG tablet Take 40 mg by mouth daily.   Yes Historical Provider, MD  Insulin Glargine (LANTUS SOLOSTAR) 100 UNIT/ML Solostar Pen Inject 7 Units into the skin at bedtime. Patient taking differently: Inject 7 Units into the skin at bedtime as needed (takes occasionally).  01/27/16  Yes Narda Bonds, MD  levETIRAcetam (KEPPRA) 1000 MG tablet Take 1,000 mg by mouth 2 (two) times daily.   Yes Historical Provider, MD  metoprolol succinate (TOPROL-XL) 25 MG 24 hr tablet Take 1 tablet (25 mg total) by mouth daily. 01/28/16  Yes Narda Bonds, MD  Oxycodone HCl 10 MG TABS Take 10 mg by mouth 3 (three) times daily as needed for pain. 05/28/16  Yes Historical Provider, MD  rosuvastatin (CRESTOR) 10 MG tablet Take 10 mg by mouth every evening.    Yes Historical Provider, MD  spironolactone (ALDACTONE) 25 MG tablet  Take 25 mg by mouth every morning. 06/12/16  Yes Historical Provider, MD  tiZANidine (ZANAFLEX) 4 MG tablet Take 4 mg by mouth 3 (three) times daily as needed for spasms. 05/28/16  Yes Historical Provider, MD    Family History No family history on file.  Social History Social History  Substance Use Topics  . Smoking status: Never Smoker  . Smokeless tobacco: Never Used  . Alcohol use No     Allergies   Patient has no known allergies.   Review of Systems Review of Systems  Unable to perform ROS: Mental status change    Physical Exam Updated Vital Signs BP (!) 143/67   Pulse 62   Temp 97.8 F (36.6 C) (Oral)   Resp 16   SpO2 96%   Physical Exam  Constitutional: No distress.  Elderly, frail    HENT:  Head: Normocephalic and atraumatic.  Right Ear: External ear normal.  Left Ear: External ear normal.  Eyes: Conjunctivae are normal. Right eye exhibits no discharge. Left eye exhibits no discharge. No scleral icterus.  Neck: Neck supple. No tracheal deviation present.  Cardiovascular: Normal rate, regular rhythm and intact distal pulses.   Pulmonary/Chest: Effort normal and breath sounds normal. No stridor. No respiratory distress. She has no wheezes. She has no rales.  Abdominal: Soft. Bowel sounds are normal. She exhibits no distension. There is generalized tenderness. There is no rigidity, no rebound and no guarding. No hernia.  Musculoskeletal: She exhibits no edema or tenderness.  Neurological: She is alert. No cranial nerve deficit (no facial droop, extraocular movements intact;  slurred/dysarthric speech) or sensory deficit. She exhibits normal muscle tone. She displays no seizure activity.  Moves all extremities   Skin: Skin is warm and dry. No rash noted.  Psychiatric: She has a normal mood and affect.  Nursing note and vitals reviewed.    ED Treatments / Results  DIAGNOSTIC STUDIES:  Oxygen Saturation is 99% on RA, normal by my interpretation.     Labs (all labs ordered are listed, but only abnormal results are displayed) Labs Reviewed  COMPREHENSIVE METABOLIC PANEL - Abnormal; Notable for the following:       Result Value   Chloride 112 (*)    Glucose, Bld 131 (*)    BUN 32 (*)    Creatinine, Ser 2.13 (*)    Albumin 2.8 (*)    AST 12 (*)    ALT 10 (*)    GFR calc non Af Amer 20 (*)    GFR calc Af Amer 23 (*)    All other components within normal limits  CBC WITH DIFFERENTIAL/PLATELET - Abnormal; Notable for the following:    WBC 14.0 (*)    Neutro Abs 11.0 (*)    Monocytes Absolute 1.2 (*)    All other components within normal limits  URINALYSIS, ROUTINE W REFLEX MICROSCOPIC - Abnormal; Notable for the following:    Hgb urine dipstick SMALL (*)     Protein, ur 100 (*)    Squamous Epithelial / LPF 0-5 (*)    All other components within normal limits  LIPASE, BLOOD      Radiology Ct Abdomen Pelvis Wo Contrast  Result Date: Jul 19, 2016 CLINICAL DATA:  Diffuse abdominal pain. Diarrhea for 2 days. Altered mental status. Elevated creatinine. EXAM: CT ABDOMEN AND PELVIS WITHOUT CONTRAST TECHNIQUE: Multidetector CT imaging of the abdomen and pelvis was performed following the standard protocol without IV contrast. COMPARISON:  None. FINDINGS: Lower chest: There is consolidation posteriorly in  the right lower lobe, and there is milder, patchy opacity in the left lower lobe. No pleural effusion. ICD lead in the right ventricle. Coronary artery atherosclerosis. Hepatobiliary: Punctate calcification in the left hepatic lobe. Prior cholecystectomy. No significant biliary dilatation. Pancreas: Fatty infiltration without evidence of ductal dilatation or inflammation. Spleen: Unremarkable. Adrenals/Urinary Tract: Unremarkable right adrenal gland. Mild left adrenal gland thickening versus motion artifact. Atrophic right kidney. No evidence of renal calculi or hydronephrosis. Unremarkable bladder. Stomach/Bowel: The stomach is within normal limits. A small amount oral contrast is present in the stomach and proximal small bowel. There is no evidence of bowel obstruction or inflammation. May small to moderate amount of stool is present in the colon. The appendix is unremarkable. Vascular/Lymphatic: Advanced atherosclerotic calcification of the abdominal aorta and its major branch vessels. No enlarged lymph nodes. Reproductive: Prior hysterectomy.  Unremarkable ovaries. Other: No intraperitoneal free fluid. Postsurgical changes along the anterior abdominal wall with suspected prior ventral hernia repair. No current hernia. Musculoskeletal: Advanced lumbar disc and facet degeneration. IMPRESSION: 1. Right greater than left lower lobe pulmonary opacities concerning for  pneumonia. 2. No acute abnormality identified in the abdomen or pelvis. 3. Advanced aortic atherosclerosis. 4. Atrophic right kidney. Electronically Signed   By: Sebastian Ache M.D.   On: 07-31-16 21:22   Dg Chest 2 View  Result Date: 2016-07-31 CLINICAL DATA:  Chest pain EXAM: CHEST  2 VIEW COMPARISON:  01/25/2016 chest radiograph. FINDINGS: Stable configuration of single lead left subclavian ICD. Stable cardiomediastinal silhouette with mild cardiomegaly and aortic atherosclerosis. No pneumothorax. No pleural effusion. No pulmonary edema. Mild scarring versus atelectasis at the left lung base. No consolidative airspace disease. Surgical clips are seen in the right upper quadrant of the abdomen. IMPRESSION: Mild scarring versus atelectasis at the left lung base. Otherwise no active cardiopulmonary disease. Stable mild cardiomegaly without pulmonary edema. Aortic atherosclerosis. Electronically Signed   By: Delbert Phenix M.D.   On: 31-Jul-2016 15:31    Procedures Procedures (including critical care time)  Medications Ordered in ED Medications  0.9 %  sodium chloride infusion ( Intravenous Stopped 07-31-2016 2017)  sodium chloride 0.9 % bolus 500 mL (not administered)  ceFEPIme (MAXIPIME) 2 g in dextrose 5 % 50 mL IVPB (not administered)  vancomycin (VANCOCIN) IVPB 1000 mg/200 mL premix (not administered)  ondansetron (ZOFRAN) injection 4 mg (4 mg Intravenous Given 31-Jul-2016 1615)  morphine 4 MG/ML injection 2 mg (2 mg Intravenous Given July 31, 2016 1617)  iopamidol (ISOVUE-300) 61 % injection (30 mLs  Contrast Given 2016-07-31 1848)     Initial Impression / Assessment and Plan / ED Course  I have reviewed the triage vital signs and the nursing notes.  Pertinent labs & imaging results that were available during my care of the patient were reviewed by me and considered in my medical decision making (see chart for details).   patient presented to the emergency room with complaints of abdominal pain. History is  somewhat limited by her speech issues. Laboratory tests show acute on chronic renal insufficiency. She does have an elevated white blood cell count. No definite urinary tract infection. CT scan was performed. No acute abdominal process however does suggest possible pneumonia. With Her elevated white blood cell count I will start her on IV antibiotics. Give her IV fluids and consult the medical service for admission.  Final Clinical Impressions(s) / ED Diagnoses   Final diagnoses:  Healthcare-associated pneumonia  Abdominal pain, unspecified abdominal location  Renal insufficiency   I personally performed the  services described in this documentation, which was scribed in my presence.  The recorded information has been reviewed and is accurate.     Linwood Dibbles, MD 07/31/2016 2212

## 2016-08-07 NOTE — ED Notes (Signed)
Pt not able tot hold bottle of contrast to drink.  This RN has been walking in the room perioptically to assist her with taking sips.

## 2016-08-07 NOTE — H&P (Signed)
History and Physical    [place] ZOX:[id number] DOB: 1930-04-23 DOA: 07/11/2016  PCP: Billee Cashing, MD   Patient coming from: Home  Chief Complaint: Nausea, diarrhea, confusion   HPI: [place] is a 83 y.o. female with medical history significant for Alzheimer's dementia, hypertension, atrial fibrillation, chronic systolic CHF, and insulin-dependent diabetes mellitus who presents the emergency department with 2 days of diarrhea, abdominal pain, and increased confusion. Patient had reportedly been in her usual state of health until complaining of some abdominal pain and nausea approximately 2 days ago. She is noted to have some loose stools yesterday and today. There has not been any vomiting. Patient has also had a cough that is chronic, but has worsened recently. She has not complained of chest pain or palpitations and there is been no recent fall or trauma. No recent complaints of headache and there has been no lower extremity edema noted. History is obtained primarily through discussion with the patient's brother, ED personnel, and review of the EMR. Patient lives at home with her son and daughter-in-law. Last hospitalization was in October 2017.  ED Course: Upon arrival to the ED, patient is found to be afebrile, saturating adequately on room air, tachypneic, and with vitals otherwise stable. Chemistry panels notable for a BUN of 32 and serum creatinine of 2.13, up from an apparent baseline of 1.7. CBC features a leukocytosis to 14,000 and urinalysis is unremarkable. CT of the abdomen and pelvis was obtained and negative for any acute intra-abdominal or intrapelvic abnormality, but notable for right greater than left lower lobe opacities concerning for pneumonia. Patient was given 500 mL in normal saline, 4 mg IV morphine, Zofran, and was started on empiric cefepime and vancomycin. Patient remains hemodynamically stable in the ED and mildly tachypneic. Given her acute kidney injury,  significant comorbidity, and ongoing tachypnea at rest, she will be admitted to the medical/surgical unit for initial treatment of her community-acquired pneumonia.  Review of Systems:  Unable to obtain complete ROS secondary to patient's clinical condition with advanced dementia.  Past Medical History:  Diagnosis Date  . Alzheimer disease    per family  . Alzheimer's dementia   . Arthritis    "all over" (01/27/2016)  . Gout   . Hypercholesteremia   . Hypertension   . Pacemaker   . Seizures (HCC)    "takes RX for them qd; don't know what kind" (01/27/2016)  . Stroke [facility]) ~ 2015   "weakness on left side; speech problems; mouth a little twisted" (01/27/2016)  . Type II diabetes mellitus (HCC)     Past Surgical History:  Procedure Laterality Date  . CATARACT EXTRACTION, BILATERAL Bilateral   . INSERT / REPLACE / REMOVE PACEMAKER  ?2014     reports that she has never smoked. She has never used smokeless tobacco. She reports that she does not drink alcohol or use drugs.  No Known Allergies  History reviewed. No pertinent family history.   Prior to Admission medications   Medication Sig Start Date End Date Taking? Authorizing Provider  aspirin 81 MG chewable tablet Chew 1 tablet (81 mg total) by mouth daily. 01/28/16  Yes Narda Bonds, MD  calcitRIOL (ROCALTROL) 0.25 MCG capsule Take 0.25 mcg by mouth 3 (three) times a week. AS DIRECTED   Yes Historical Provider, MD  febuxostat (ULORIC) 40 MG tablet Take 40 mg by mouth daily.   Yes Historical Provider, MD  Insulin Glargine (LANTUS SOLOSTAR) 100 UNIT/ML Solostar Pen Inject 7 Units into the  skin at bedtime. Patient taking differently: Inject 7 Units into the skin at bedtime as needed (takes occasionally).  01/27/16  Yes Narda Bonds, MD  levETIRAcetam (KEPPRA) 1000 MG tablet Take 1,000 mg by mouth 2 (two) times daily.   Yes Historical Provider, MD  metoprolol succinate (TOPROL-XL) 25 MG 24 hr tablet Take 1 tablet (25 mg  total) by mouth daily. 01/28/16  Yes Narda Bonds, MD  Oxycodone HCl 10 MG TABS Take 10 mg by mouth 3 (three) times daily as needed for pain. 05/28/16  Yes Historical Provider, MD  rosuvastatin (CRESTOR) 10 MG tablet Take 10 mg by mouth every evening.    Yes Historical Provider, MD  spironolactone (ALDACTONE) 25 MG tablet Take 25 mg by mouth every morning. 06/12/16  Yes Historical Provider, MD  tiZANidine (ZANAFLEX) 4 MG tablet Take 4 mg by mouth 3 (three) times daily as needed for spasms. 05/28/16  Yes Historical Provider, MD    Physical Exam: Vitals:   07-21-16 2021 2016-07-21 2115 21-Jul-2016 2145 07-21-2016 2200  BP:  (!) 144/79 (!) 144/82 (!) 143/67  Pulse: 60 78 66 62  Resp:  16  16  Temp:      TempSrc:      SpO2: 95% 99% 98% 96%      Constitutional: Chronically ill and uncomfortable in appearance. No pallor or diaphoresis. Non-toxic appearance.  Eyes: PERTLA, lids and conjunctivae normal ENMT: Mucous membranes are moist. Posterior pharynx clear of any exudate or lesions.   Neck: normal, supple, no masses, no thyromegaly Respiratory: Tachypnea, rhonchi at bilateral bases and right mid-lung zone. Increased WOB. No pallor or cyanosis.  Cardiovascular: Rate ~80 and irregular with soft systolic murmur at apex. No extremity edema. No significant JVD. Abdomen: No distension, no tenderness, no masses palpated. Bowel sounds normal.  Musculoskeletal: no clubbing / cyanosis. No joint deformity upper and lower extremities.   Skin: no significant rashes, lesions, ulcers. Warm, dry, well-perfused. Poor turgor.  Neurologic: No gross facial asymmetry, moving all extremities spontaneously. PERRL, EOMI.   Psychiatric: Alert and oriented to person and place only. Pleasant and cooperative.     Labs on Admission: I have personally reviewed following labs and imaging studies  CBC:  Recent Labs Lab 2016-07-21 1614  WBC 14.0*  NEUTROABS 11.0*  HGB 14.4  HCT 43.1  MCV 89.6  PLT 150   Basic  Metabolic Panel:  Recent Labs Lab Jul 21, 2016 1614  NA 143  K 4.2  CL 112*  CO2 22  GLUCOSE 131*  BUN 32*  CREATININE 2.13*  CALCIUM 9.4   GFR: CrCl cannot be calculated (Unknown ideal weight.). Liver Function Tests:  Recent Labs Lab Jul 21, 2016 1614  AST 12*  ALT 10*  ALKPHOS 88  BILITOT 0.7  PROT 6.7  ALBUMIN 2.8*    Recent Labs Lab 2016/07/21 1614  LIPASE 11   No results for input(s): AMMONIA in the last 168 hours. Coagulation Profile: No results for input(s): INR, PROTIME in the last 168 hours. Cardiac Enzymes: No results for input(s): CKTOTAL, CKMB, CKMBINDEX, TROPONINI in the last 168 hours. BNP (last 3 results) No results for input(s): PROBNP in the last 8760 hours. HbA1C: No results for input(s): HGBA1C in the last 72 hours. CBG: No results for input(s): GLUCAP in the last 168 hours. Lipid Profile: No results for input(s): CHOL, HDL, LDLCALC, TRIG, CHOLHDL, LDLDIRECT in the last 72 hours. Thyroid Function Tests: No results for input(s): TSH, T4TOTAL, FREET4, T3FREE, THYROIDAB in the last 72 hours. Anemia Panel: No  results for input(s): VITAMINB12, FOLATE, FERRITIN, TIBC, IRON, RETICCTPCT in the last 72 hours. Urine analysis:    Component Value Date/Time   COLORURINE YELLOW 30-Jul-2016 1446   APPEARANCEUR CLEAR 07-30-16 1446   LABSPEC 1.019 07/30/2016 1446   PHURINE 5.0 07-30-2016 1446   GLUCOSEU NEGATIVE July 30, 2016 1446   HGBUR SMALL (A) 07/30/2016 1446   BILIRUBINUR NEGATIVE July 30, 2016 1446   KETONESUR NEGATIVE 07/30/16 1446   PROTEINUR 100 (A) 07-30-16 1446   NITRITE NEGATIVE Jul 30, 2016 1446   LEUKOCYTESUR NEGATIVE 07-30-2016 1446   Sepsis Labs: (procalcitonin:4,lacticidven:4) )No results found for this or any previous visit (from the past 240 hour(s)).   Radiological Exams on Admission: Ct Abdomen Pelvis Wo Contrast  Result Date: 07/30/16 CLINICAL DATA:  Diffuse abdominal pain. Diarrhea for 2 days. Altered mental status.  Elevated creatinine. EXAM: CT ABDOMEN AND PELVIS WITHOUT CONTRAST TECHNIQUE: Multidetector CT imaging of the abdomen and pelvis was performed following the standard protocol without IV contrast. COMPARISON:  None. FINDINGS: Lower chest: There is consolidation posteriorly in the right lower lobe, and there is milder, patchy opacity in the left lower lobe. No pleural effusion. ICD lead in the right ventricle. Coronary artery atherosclerosis. Hepatobiliary: Punctate calcification in the left hepatic lobe. Prior cholecystectomy. No significant biliary dilatation. Pancreas: Fatty infiltration without evidence of ductal dilatation or inflammation. Spleen: Unremarkable. Adrenals/Urinary Tract: Unremarkable right adrenal gland. Mild left adrenal gland thickening versus motion artifact. Atrophic right kidney. No evidence of renal calculi or hydronephrosis. Unremarkable bladder. Stomach/Bowel: The stomach is within normal limits. A small amount oral contrast is present in the stomach and proximal small bowel. There is no evidence of bowel obstruction or inflammation. May small to moderate amount of stool is present in the colon. The appendix is unremarkable. Vascular/Lymphatic: Advanced atherosclerotic calcification of the abdominal aorta and its major branch vessels. No enlarged lymph nodes. Reproductive: Prior hysterectomy.  Unremarkable ovaries. Other: No intraperitoneal free fluid. Postsurgical changes along the anterior abdominal wall with suspected prior ventral hernia repair. No current hernia. Musculoskeletal: Advanced lumbar disc and facet degeneration. IMPRESSION: 1. Right greater than left lower lobe pulmonary opacities concerning for pneumonia. 2. No acute abnormality identified in the abdomen or pelvis. 3. Advanced aortic atherosclerosis. 4. Atrophic right kidney. Electronically Signed   By: Sebastian Ache M.D.   On: 07-30-16 21:22   Dg Chest 2 View  Result Date: 07/30/2016 CLINICAL DATA:  Chest pain EXAM:  CHEST  2 VIEW COMPARISON:  01/25/2016 chest radiograph. FINDINGS: Stable configuration of single lead left subclavian ICD. Stable cardiomediastinal silhouette with mild cardiomegaly and aortic atherosclerosis. No pneumothorax. No pleural effusion. No pulmonary edema. Mild scarring versus atelectasis at the left lung base. No consolidative airspace disease. Surgical clips are seen in the right upper quadrant of the abdomen. IMPRESSION: Mild scarring versus atelectasis at the left lung base. Otherwise no active cardiopulmonary disease. Stable mild cardiomegaly without pulmonary edema. Aortic atherosclerosis. Electronically Signed   By: Delbert Phenix M.D.   On: July 30, 2016 15:31    EKG: Not performed, will obtain as appropriate. Rate-controlled atrial fibrillation on cardiac monitor.    Assessment/Plan  1. Community-acquired PNA  - Presents with abdominal pain and diarrhea, noted to be tachypneic with leukocytosis and AKI  - CT abd/pelvis negative for acute intraabdominal or intrapelvic abnormality, but reveals bibasilar infiltrates concerning for PNA  - She was given 500 cc NS in ED and started on empiric vancomycin and cefepime  - She does not appear to have risk factors for MDR organisms and  will be treated with Rocephin and azithromycin while following cultures and clinical response to therapy   2. Acute kidney injury superimposed on CKD stage IV  - SCr is 2.13 on admission, up from apparent baseline of ~1.7  - Likely a prerenal azotemia in setting of acute infection and recent diarrhea  - She was given 500 cc NS in ED - She has hx mod-severe LV systolic dysfunction and will need to be cautious with IVF  - Hold Aldactone, continue NS infusion at 75 cc/hr overnight, reassess fluid-status and chem panel in am   3. Insulin-dependent DM  - A1c only 5.6% in October 2017  - Managed at home with Lantus 7 units qHS  - Check CBG with meals and qHS  - Treat with low-intensity Novolog correctional only  for now and adjust prn   4. Chronic atrial fibrillation  - In rate-controlled a fib on admission  - CHADS-VASc is 12 (age x2, CVA x2, gender, CHF, DM) - She has not been fully anticoagulated d/t fall-risk  - Continue ASA and metoprolol    5. Seizures  - Stable per report of pt's brother  - Continue Keppra    6. Chronic pain  - Pt complains of some abdominal pain on admission; abd exam benign and CT neg - Continue her home regimen of prn Oxycodone   7. Hx of CVA   - No new deficit identified  - Continue Crestor and ASA   8. Chronic systolic CHF  - Pt appears dry on admission in setting of acute infection and recent diarrhea  - TTE (01/27/16) with EF 30-35%, mild LVH, diffuse HK, mild AR, mild TR  - Managed at home with Aldactone and metoprolol  - Aldactone held on admission; Toprol to be continued as tolerated  - Follow daily wts and I/O's; resume diuretic as indicated   9. Diarrhea  - Pt reports 2 days of diarrhea; none since arrival  - Abdominal exam is benign and CT abd/pelvis negative for acute pathology  - Monitor, consider stool studies if recurs   DVT prophylaxis: sq heparin  Code Status: Partial, no chest compressions Family Communication: Son updated by phone  Disposition Plan: Admit to med-surg Consults called: None Admission status: Inpatient    Briscoe Deutscher, MD [facility] Pager [phone]  If 7PM-7AM, please contact night-coverage [email or web address] Password [facility]  2016/07/09, 10:53 PM

## 2016-08-07 DEATH — deceased

## 2018-03-06 IMAGING — CT CT ABD-PELV W/O CM
2 of 4 series · 11 of 46 positions shown, 12 images · non-contrast
Comparison: None.

CLINICAL DATA: Diffuse abdominal pain. Diarrhea for 2 days. Altered
mental status. Elevated creatinine.

EXAM:
CT ABDOMEN AND PELVIS WITHOUT CONTRAST
TECHNIQUE: Multidetector CT imaging of the abdomen and pelvis was performed
following the standard protocol without IV contrast.

[Series 301: routine, idose (2) · axial · 0.78mm/px · z∈[+530,+900]mm · 8 of 88 slices shown, 9 images]
[im 7/88  soft-tissue]
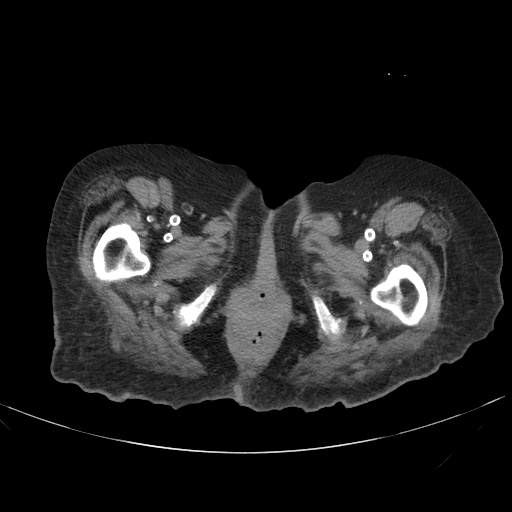
[im 7/88  bone]
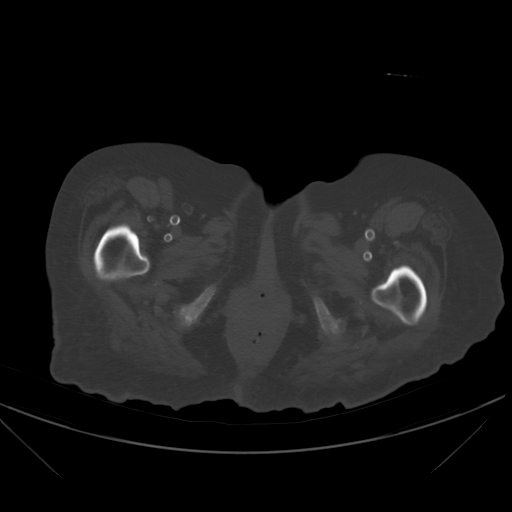
[im 18/88  soft-tissue]
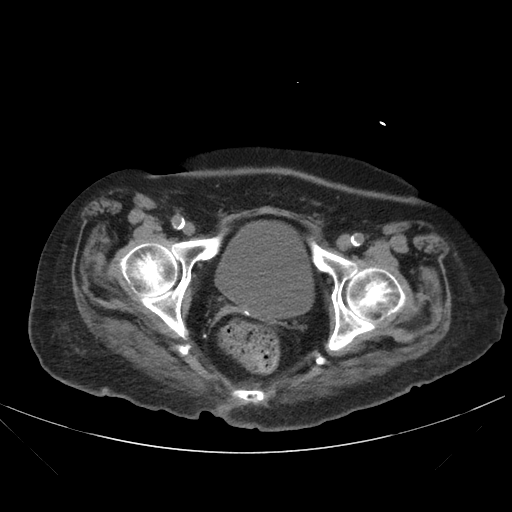
[im 28/88  soft-tissue]
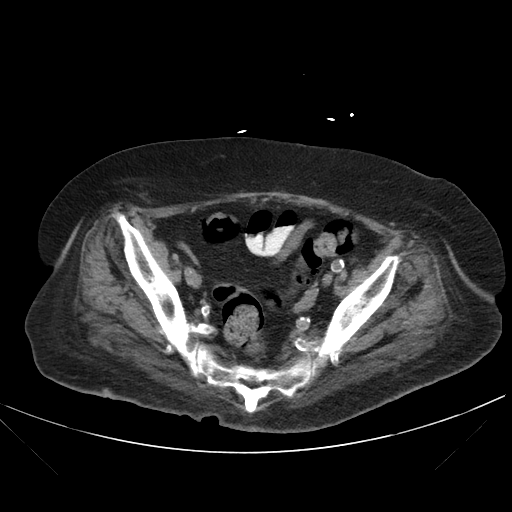
[im 39/88  soft-tissue]
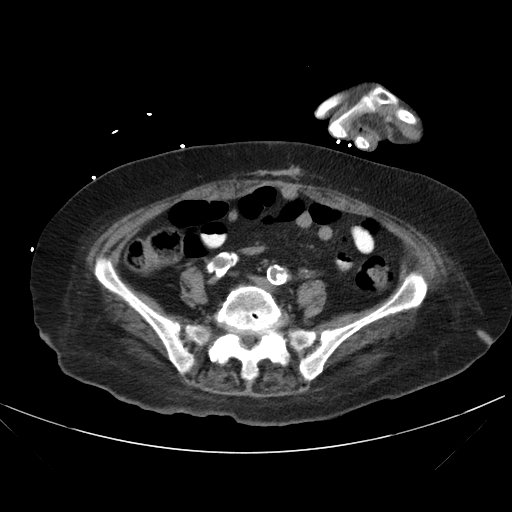
[im 49/88  soft-tissue]
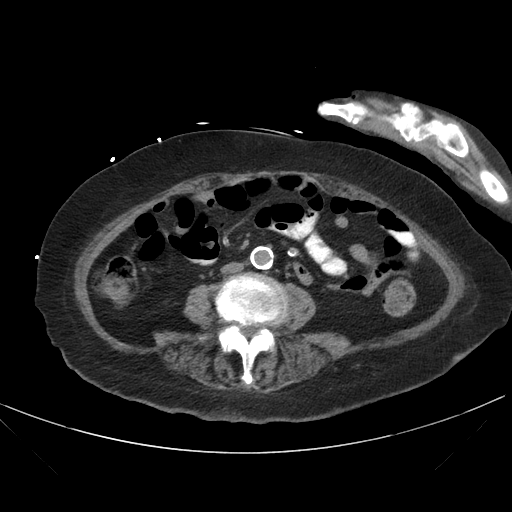
[im 60/88  soft-tissue]
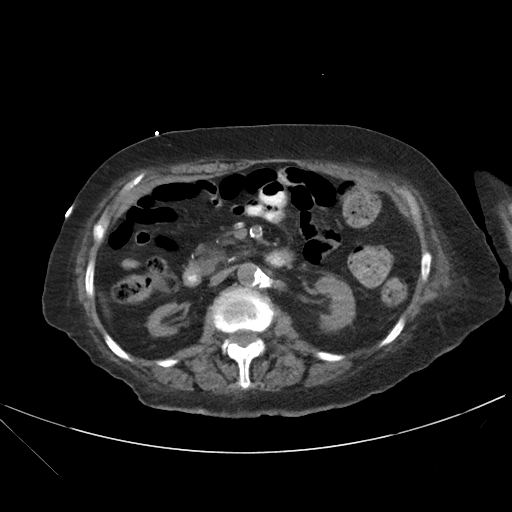
[im 70/88  soft-tissue]
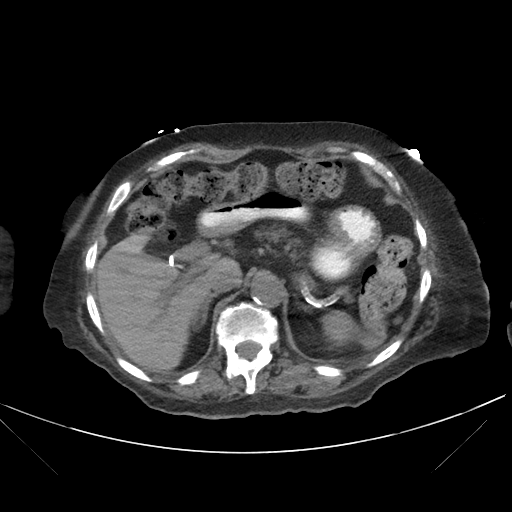
[im 81/88  soft-tissue]
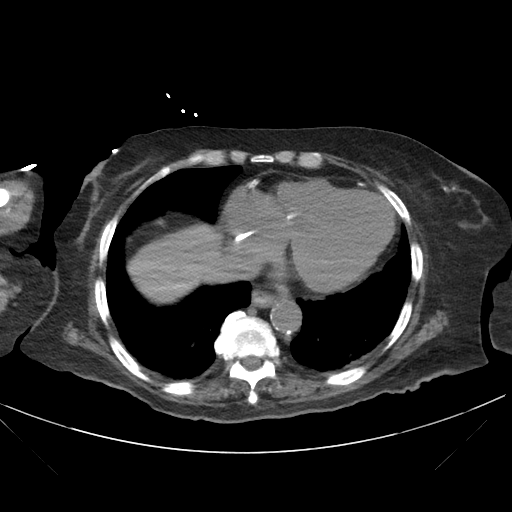

[Series 303: coronals, idose (2) · coronal · 0.45mm/px · 3 of 133 slices shown]
[im 45/133  soft-tissue]
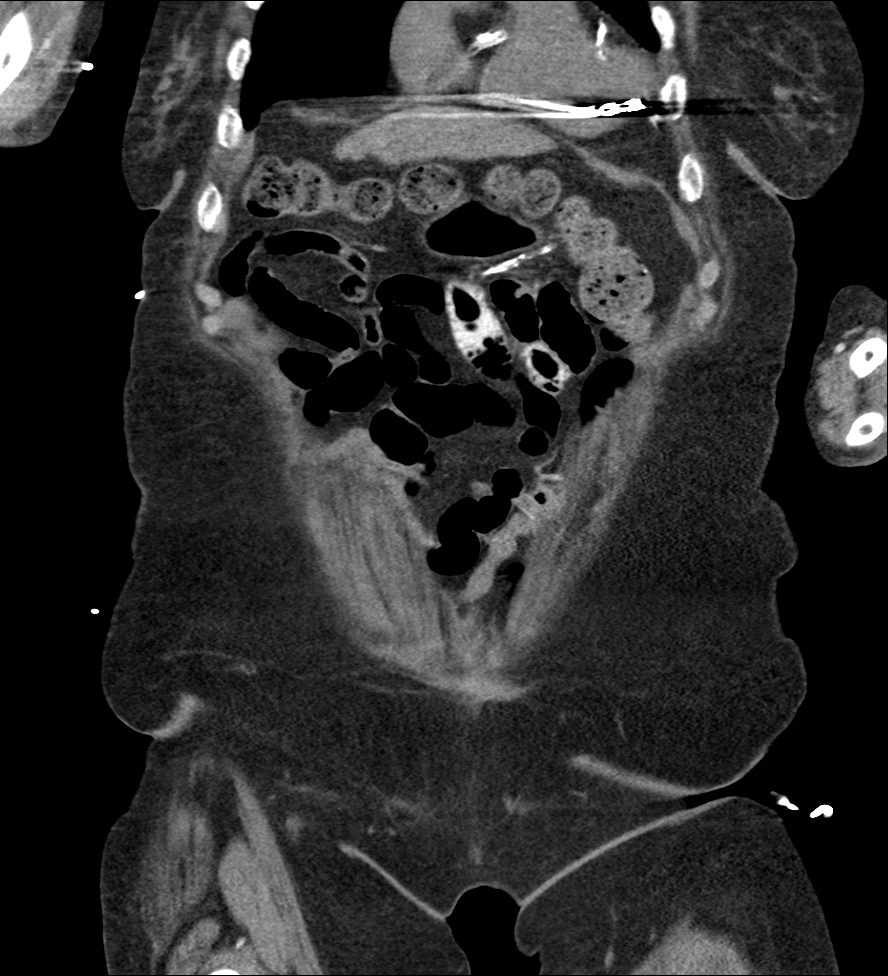
[im 59/133  soft-tissue]
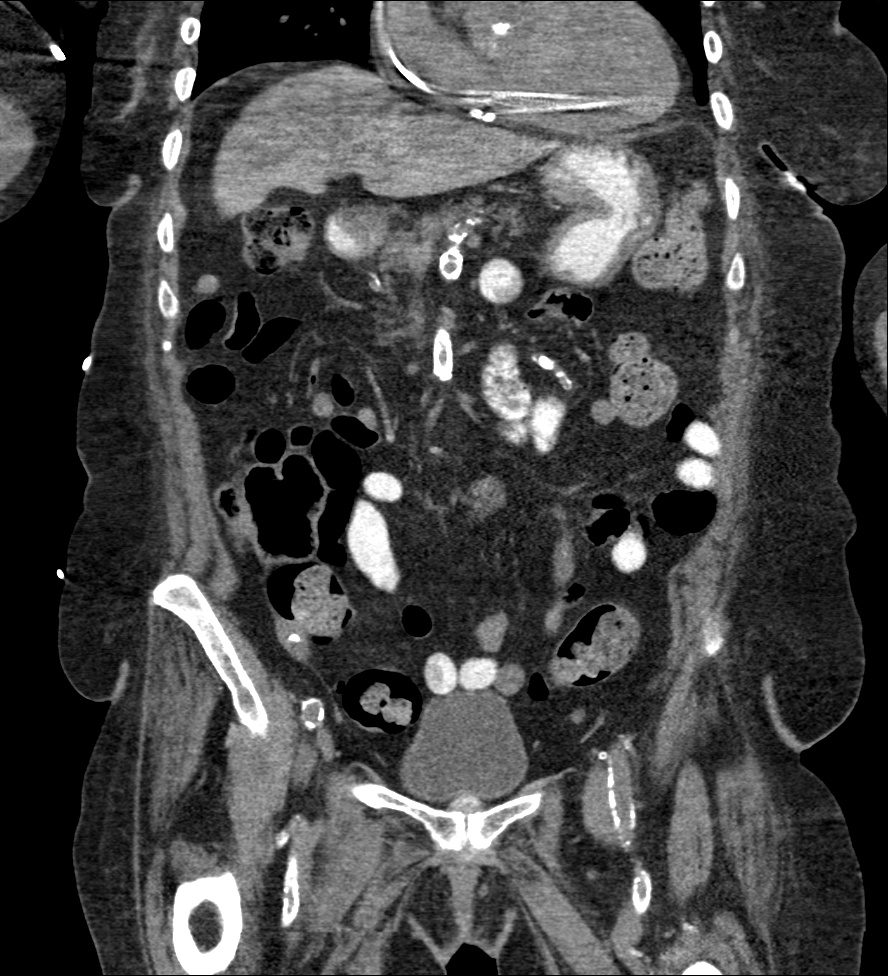
[im 74/133  soft-tissue]
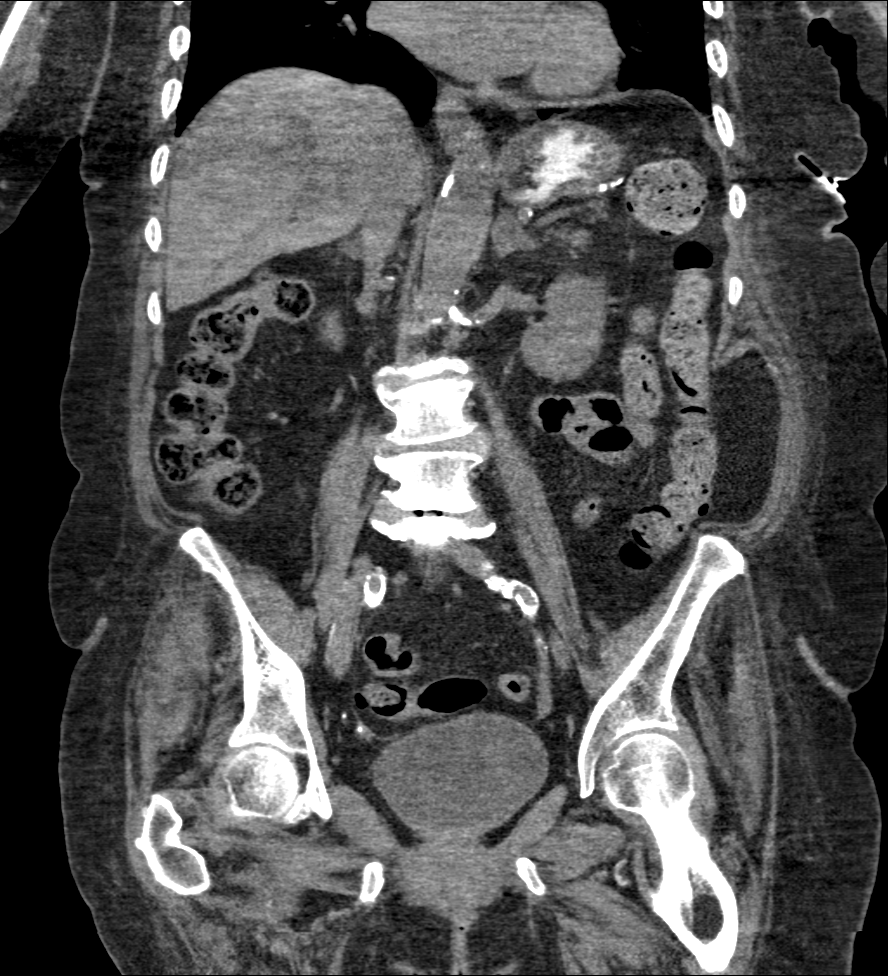

[11 of 46 positions shown; findings below may reference images not displayed]

FINDINGS: Lower chest: There is consolidation posteriorly in the right lower
lobe, and there is milder, patchy opacity in the left lower lobe. No
pleural effusion. ICD lead in the right ventricle. Coronary artery
atherosclerosis.

Hepatobiliary: Punctate calcification in the left hepatic lobe.
Prior cholecystectomy. No significant biliary dilatation.

Pancreas: Fatty infiltration without evidence of ductal dilatation
or inflammation.

Spleen: Unremarkable.

Adrenals/Urinary Tract: Unremarkable right adrenal gland. Mild left
adrenal gland thickening versus motion artifact. Atrophic right
kidney. No evidence of renal calculi or hydronephrosis. Unremarkable
bladder.

Stomach/Bowel: The stomach is within normal limits. A small amount
oral contrast is present in the stomach and proximal small bowel.
There is no evidence of bowel obstruction or inflammation. May small
to moderate amount of stool is present in the colon. The appendix is
unremarkable.

Vascular/Lymphatic: Advanced atherosclerotic calcification of the
abdominal aorta and its major branch vessels. No enlarged lymph
nodes.

Reproductive: Prior hysterectomy.  Unremarkable ovaries.

Other: No intraperitoneal free fluid. Postsurgical changes along the
anterior abdominal wall with suspected prior ventral hernia repair.
No current hernia.

Musculoskeletal: Advanced lumbar disc and facet degeneration.
IMPRESSION: 1. Right greater than left lower lobe pulmonary opacities concerning
for pneumonia.
2. No acute abnormality identified in the abdomen or pelvis.
3. Advanced aortic atherosclerosis.
4. Atrophic right kidney.
